# Patient Record
Sex: Male | Born: 1945
Health system: Southern US, Community
[De-identification: ages and names within clinical notes are randomized; demographics above are authoritative.]

## PROBLEM LIST (undated history)

## (undated) DIAGNOSIS — I10 Essential (primary) hypertension: Secondary | ICD-10-CM

## (undated) DIAGNOSIS — J302 Other seasonal allergic rhinitis: Secondary | ICD-10-CM

## (undated) DIAGNOSIS — E785 Hyperlipidemia, unspecified: Secondary | ICD-10-CM

## (undated) DIAGNOSIS — M1990 Unspecified osteoarthritis, unspecified site: Secondary | ICD-10-CM

## (undated) DIAGNOSIS — M549 Dorsalgia, unspecified: Secondary | ICD-10-CM

## (undated) DIAGNOSIS — I456 Pre-excitation syndrome: Secondary | ICD-10-CM

## (undated) DIAGNOSIS — I351 Nonrheumatic aortic (valve) insufficiency: Secondary | ICD-10-CM

## (undated) DIAGNOSIS — G8929 Other chronic pain: Secondary | ICD-10-CM

## (undated) HISTORY — DX: Hyperlipidemia, unspecified: E78.5

## (undated) HISTORY — DX: Pre-excitation syndrome: I45.6

## (undated) HISTORY — DX: Dorsalgia, unspecified: M54.9

## (undated) HISTORY — DX: Other chronic pain: G89.29

## (undated) HISTORY — DX: Essential (primary) hypertension: I10

## (undated) HISTORY — DX: Unspecified osteoarthritis, unspecified site: M19.90

## (undated) HISTORY — PX: APPENDECTOMY: SHX54

## (undated) HISTORY — DX: Other seasonal allergic rhinitis: J30.2

## (undated) HISTORY — PX: KNEE SURGERY: SHX244

## (undated) HISTORY — DX: Nonrheumatic aortic (valve) insufficiency: I35.1

## (undated) HISTORY — PX: CHOLECYSTECTOMY: SHX55

## (undated) HISTORY — PX: CERVICAL SPINE SURGERY: SHX589

---

## 1999-08-05 ENCOUNTER — Ambulatory Visit (HOSPITAL_COMMUNITY): Admission: RE | Admit: 1999-08-05 | Discharge: 1999-08-05 | Payer: Self-pay | Admitting: General Surgery

## 2000-03-16 ENCOUNTER — Encounter: Payer: Self-pay | Admitting: Emergency Medicine

## 2000-03-16 ENCOUNTER — Emergency Department (HOSPITAL_COMMUNITY): Admission: EM | Admit: 2000-03-16 | Discharge: 2000-03-16 | Payer: Self-pay | Admitting: Emergency Medicine

## 2000-06-09 ENCOUNTER — Emergency Department (HOSPITAL_COMMUNITY): Admission: EM | Admit: 2000-06-09 | Discharge: 2000-06-09 | Payer: Self-pay | Admitting: Emergency Medicine

## 2002-05-11 ENCOUNTER — Encounter (INDEPENDENT_AMBULATORY_CARE_PROVIDER_SITE_OTHER): Payer: Self-pay | Admitting: Specialist

## 2002-05-11 ENCOUNTER — Observation Stay (HOSPITAL_COMMUNITY): Admission: EM | Admit: 2002-05-11 | Discharge: 2002-05-11 | Payer: Self-pay | Admitting: Emergency Medicine

## 2003-06-10 ENCOUNTER — Ambulatory Visit (HOSPITAL_COMMUNITY): Admission: RE | Admit: 2003-06-10 | Discharge: 2003-06-10 | Payer: Self-pay | Admitting: Gastroenterology

## 2010-03-07 ENCOUNTER — Ambulatory Visit (HOSPITAL_COMMUNITY): Admission: RE | Admit: 2010-03-07 | Discharge: 2010-03-07 | Payer: Self-pay | Admitting: Neurosurgery

## 2011-03-20 LAB — BASIC METABOLIC PANEL
BUN: 17 mg/dL (ref 6–23)
CO2: 28 mEq/L (ref 19–32)
Calcium: 9.7 mg/dL (ref 8.4–10.5)
Chloride: 101 mEq/L (ref 96–112)
Creatinine, Ser: 1.48 mg/dL (ref 0.4–1.5)
GFR calc Af Amer: 58 mL/min — ABNORMAL LOW (ref >60)
GFR calc non Af Amer: 48 mL/min — ABNORMAL LOW (ref >60)
Glucose, Bld: 79 mg/dL (ref 70–99)
Potassium: 4.7 mEq/L (ref 3.5–5.1)
Sodium: 135 mEq/L (ref 135–145)

## 2011-03-20 LAB — CBC
HCT: 47.2 % (ref 39.0–52.0)
Hemoglobin: 16.1 g/dL (ref 13.0–17.0)
RBC: 5.05 MIL/uL (ref 4.22–5.81)
RDW: 13.7 % (ref 11.5–15.5)

## 2011-03-20 LAB — SURGICAL PCR SCREEN: MRSA, PCR: NEGATIVE

## 2011-03-28 ENCOUNTER — Ambulatory Visit (HOSPITAL_COMMUNITY)
Admission: RE | Admit: 2011-03-28 | Discharge: 2011-03-28 | Disposition: A | Discharge status: Home / Self Care | Payer: 59 | Source: Ambulatory Visit

## 2011-03-28 ENCOUNTER — Inpatient Hospital Stay (HOSPITAL_COMMUNITY)
Admission: EM | Admit: 2011-03-28 | Discharge: 2011-03-30 | DRG: 310 | Disposition: A | Discharge status: Home / Self Care | Payer: 59 | Attending: Cardiology | Admitting: Cardiology

## 2011-03-28 DIAGNOSIS — I080 Rheumatic disorders of both mitral and aortic valves: Secondary | ICD-10-CM | POA: Diagnosis present

## 2011-03-28 DIAGNOSIS — I10 Essential (primary) hypertension: Secondary | ICD-10-CM | POA: Diagnosis present

## 2011-03-28 DIAGNOSIS — G8929 Other chronic pain: Secondary | ICD-10-CM | POA: Diagnosis present

## 2011-03-28 DIAGNOSIS — E785 Hyperlipidemia, unspecified: Secondary | ICD-10-CM | POA: Diagnosis present

## 2011-03-28 DIAGNOSIS — Z7982 Long term (current) use of aspirin: Secondary | ICD-10-CM

## 2011-03-28 DIAGNOSIS — M129 Arthropathy, unspecified: Secondary | ICD-10-CM | POA: Diagnosis present

## 2011-03-28 DIAGNOSIS — I471 Supraventricular tachycardia, unspecified: Principal | ICD-10-CM | POA: Diagnosis present

## 2011-03-28 DIAGNOSIS — M549 Dorsalgia, unspecified: Secondary | ICD-10-CM | POA: Diagnosis present

## 2011-03-28 DIAGNOSIS — R0789 Other chest pain: Secondary | ICD-10-CM | POA: Diagnosis present

## 2011-03-28 DIAGNOSIS — I4949 Other premature depolarization: Secondary | ICD-10-CM | POA: Diagnosis present

## 2011-03-28 LAB — DIFFERENTIAL
Basophils Absolute: 0 K/uL (ref 0.0–0.1)
Basophils Relative: 0 % (ref 0–1)
Eosinophils Absolute: 0.1 K/uL (ref 0.0–0.7)
Eosinophils Relative: 1 % (ref 0–5)
Lymphocytes Relative: 24 % (ref 12–46)
Lymphs Abs: 2.1 10*3/uL (ref 0.7–4.0)
Monocytes Absolute: 0.8 K/uL (ref 0.1–1.0)
Monocytes Relative: 9 % (ref 3–12)
Neutro Abs: 5.6 10*3/uL (ref 1.7–7.7)
Neutrophils Relative %: 65 % (ref 43–77)

## 2011-03-28 LAB — BASIC METABOLIC PANEL WITH GFR
Calcium: 9.2 mg/dL (ref 8.4–10.5)
GFR calc Af Amer: >60 mL/min (ref >60)
GFR calc non Af Amer: 58 mL/min — ABNORMAL LOW (ref >60)
Glucose, Bld: 79 mg/dL (ref 70–99)
Sodium: 139 meq/L (ref 135–145)

## 2011-03-28 LAB — CBC
HCT: 46.3 % (ref 39.0–52.0)
Hemoglobin: 16.3 g/dL (ref 13.0–17.0)
MCH: 31.3 pg (ref 26.0–34.0)
MCHC: 35.2 g/dL (ref 30.0–36.0)
MCV: 89 fL (ref 78.0–100.0)
Platelets: 276 K/uL (ref 150–400)
RBC: 5.2 MIL/uL (ref 4.22–5.81)
RDW: 13.8 % (ref 11.5–15.5)
WBC: 8.6 10*3/uL (ref 4.0–10.5)

## 2011-03-28 LAB — BASIC METABOLIC PANEL
BUN: 10 mg/dL (ref 6–23)
CO2: 20 mEq/L (ref 19–32)
Chloride: 110 mEq/L (ref 96–112)
Creatinine, Ser: 1.26 mg/dL (ref 0.4–1.5)
Potassium: 3.6 mEq/L (ref 3.5–5.1)

## 2011-03-28 LAB — COMPREHENSIVE METABOLIC PANEL
ALT: 18 U/L (ref 0–53)
AST: 25 U/L (ref 0–37)
CO2: 25 mEq/L (ref 19–32)
Calcium: 9.4 mg/dL (ref 8.4–10.5)
Creatinine, Ser: 1.35 mg/dL (ref 0.4–1.5)
GFR calc Af Amer: >60 mL/min (ref >60)
GFR calc non Af Amer: 53 mL/min — ABNORMAL LOW (ref >60)
Glucose, Bld: 112 mg/dL — ABNORMAL HIGH (ref 70–99)
Sodium: 143 mEq/L (ref 135–145)
Total Protein: 7.5 g/dL (ref 6.0–8.3)

## 2011-03-28 LAB — CK TOTAL AND CKMB (NOT AT ARMC)
Relative Index: INVALID (ref 0.0–2.5)
Total CK: 53 U/L (ref 7–232)

## 2011-03-28 LAB — APTT: aPTT: 25 seconds (ref 24–37)

## 2011-03-28 LAB — MAGNESIUM: Magnesium: 2.1 mg/dL (ref 1.5–2.5)

## 2011-03-28 LAB — TROPONIN I: Troponin I: 0.04 ng/mL (ref 0.00–0.06)

## 2011-03-29 DIAGNOSIS — I471 Supraventricular tachycardia: Secondary | ICD-10-CM

## 2011-03-29 DIAGNOSIS — R55 Syncope and collapse: Secondary | ICD-10-CM

## 2011-03-29 LAB — CARDIAC PANEL(CRET KIN+CKTOT+MB+TROPI)
CK, MB: 0.8 ng/mL (ref 0.3–4.0)
CK, MB: 0.8 ng/mL (ref 0.3–4.0)
Relative Index: INVALID (ref 0.0–2.5)
Total CK: 42 U/L (ref 7–232)
Total CK: 48 U/L (ref 7–232)
Troponin I: 0.03 ng/mL (ref 0.00–0.06)

## 2011-03-29 LAB — CBC
Hemoglobin: 14.4 g/dL (ref 13.0–17.0)
MCH: 30.1 pg (ref 26.0–34.0)
Platelets: 255 10*3/uL (ref 150–400)
RBC: 4.78 MIL/uL (ref 4.22–5.81)
WBC: 7.3 10*3/uL (ref 4.0–10.5)

## 2011-03-29 LAB — POCT I-STAT, CHEM 8
BUN: 11 mg/dL (ref 6–23)
Calcium, Ion: 1.14 mmol/L (ref 1.12–1.32)
Chloride: 109 mEq/L (ref 96–112)
Creatinine, Ser: 1.3 mg/dL (ref 0.4–1.5)
Glucose, Bld: 79 mg/dL (ref 70–99)
TCO2: 21 mmol/L (ref 0–100)

## 2011-03-29 LAB — POCT CARDIAC MARKERS

## 2011-03-29 LAB — TSH: TSH: 2.561 u[IU]/mL (ref 0.350–4.500)

## 2011-03-30 LAB — BASIC METABOLIC PANEL
Calcium: 9.2 mg/dL (ref 8.4–10.5)
GFR calc Af Amer: >60 mL/min (ref >60)
GFR calc non Af Amer: 58 mL/min — ABNORMAL LOW (ref >60)
Glucose, Bld: 89 mg/dL (ref 70–99)
Potassium: 4.3 mEq/L (ref 3.5–5.1)
Sodium: 140 mEq/L (ref 135–145)

## 2011-03-31 NOTE — Consult Note (Signed)
NAMECORINTHIAN, MIZRAHI                ACCOUNT NO.:  [id number]  MEDICAL RECORD NO.:  [id number]           PATIENT TYPE:  I  LOCATION:  2022                         FACILITY:  [facility]  PHYSICIAN:  Doylene Canning. Ladona Ridgel, MD    DATE OF BIRTH:  [birth year]  DATE OF CONSULTATION:  03/29/2011 DATE OF DISCHARGE:  03/28/2011                                CONSULTATION   REFERRING PHYSICIAN:  Jake Bathe, MD  INDICATION FOR CONSULTATION:  Evaluation of SVT associated with history of WPW syndrome in a patient with shoulder and back pain associated with near-syncope.  HISTORY OF PRESENT ILLNESS:  The patient is a very pleasant 65 year old man who was diagnosed with WPW syndrome approximately 20 years ago.  He has had very minimal palpitations.  He has been otherwise asymptomatic, but over the last several months has noted increasingly frequent episodes where he would get dizzy and lightheaded.  He has not had any syncope.  He was in his usual state of health until yesterday when he presented to the emergency room with an episode of near syncope.  He noted that he also had associated left shoulder and leg numbness.  He felt lightheaded.  He denied frank chest pain and denied shortness of breath.  These episodes typically would last only a few seconds and they were associated with diaphoresis.  He was taken to the emergency room where he was found to have frequent PVCs and PACs and runs of SVT.  The patient notes that he would typically have spells like this lasting for several minutes at a time.  It would start and stop without warning. During these episodes, he would never have the sensation that his heart was beating fast.  He has never had frank syncope.  He otherwise feels well.  PAST MEDICAL HISTORY:  Notable for, 1. Hypertension. 2. Dyslipidemia. 3. Arthritis.  SURGICAL HISTORY:  Notable for knee surgery, appendectomy, and cholecystectomy.  He has had surgery on his cervical spine.  He was  not on beta-blockers on admission.  His outpatient medications include Benicar and aspirin, steroid nasal spray, simvastatin, and multivitamins.  His family history is notable for a father who died of stroke in his 71s.  Mother is alive and well.  SOCIAL HISTORY:  The patient is married.  He continues to work.  He stopped smoking cigarettes almost 10 years ago and denies alcohol abuse.  REVIEW OF SYSTEMS:  Negative except as noted in the HPI except for some mild arthritic complaints.  PHYSICAL EXAMINATION:  GENERAL:  He is a pleasant, very well-appearing middle-aged man in no distress. VITAL SIGNS:  Blood pressure was 130/80, the pulse was 80 and regular, respirations were 18, temperature was 98. HEENT:  Normocephalic and atraumatic.  Pupils equal and round. Oropharynx is moist.  Sclerae anicteric. NECK:  No jugular venous distention.  There is no thyromegaly. LUNGS:  Clear bilaterally to auscultation.  No wheezes, rales, or rhonchi are present. CARDIOVASCULAR:  Regular rate and rhythm with normal S1 and S2. ABDOMEN:  Soft, nontender, nondistended.  There was no organomegaly. Bowel sounds are present.  No rebound or  guarding. EXTREMITIES:  No cyanosis, clubbing, or edema.  Pulses were 2+ and symmetric. NEUROLOGIC:  Alert and oriented x3.  Cranial nerves intact.  Strength is 5/5 and symmetric.  The EKG demonstrates sinus rhythm with minimal pre-excitation and runs of narrow QRS tachycardia at 170 beats per minute.  There is also fairly frequent PVCs.  IMPRESSION: 1. Symptomatic supraventricular tachycardia with his symptoms being     manifested not by palpitations, but by dizziness and     lightheadedness, and back and arm discomfort without any obvious     anginal symptoms. 2. Hypertension. 3. Dyslipidemia. 4. Documented supraventricular tachycardia.  DISCUSSION:  The patient is having symptoms from his SVT and WPW syndrome, but has not been on medical therapy.  His  initial treatment with beta-blockers seems to have improved his ectopy.  I recommended that he be observed in the hospital for additional 12 hours and if his arrhythmias remain quiet be allowed to be discharged home.  My approach will be to see him back in the office in approximately 2 weeks.  If he continues to do well then we will continue medical therapy with a beta- blocker.  If he has any recurrent symptoms then catheter ablation would be warranted of his SVT.  I suspect this is a left lateral accessory pathway.  I have discussed all these issues with the patient and he agrees.  We will see him back in the office in several weeks.     Doylene Canning. Ladona Ridgel, MD     GWT/MEDQ  D:  03/29/2011  T:  03/30/2011  Job:  [id number]  cc:   Armanda Magic, M.D. Jake Bathe, MD  Electronically Signed by Lewayne Bunting MD on 03/31/2011 06:49:01 AM

## 2011-04-11 NOTE — Discharge Summary (Signed)
[name], [name]                ACCOUNT NO.:  [id number]  MEDICAL RECORD NO.:  [id number]           PATIENT TYPE:  I  LOCATION:  2022                         FACILITY:  [facility]  PHYSICIAN:  Phillip Bathe, MD      DATE OF BIRTH:  May 01, 1946  DATE OF ADMISSION:  03/28/2011 DATE OF DISCHARGE:  03/30/2011                              DISCHARGE SUMMARY   CARDIOLOGIST:  Phillip Bathe, MD  ELECTROPHYSIOLOGIST:  Phillip Canning. Ladona Ridgel, MD  PRIMARY PHYSICIAN:  Phillip Rua, MD, [name]  FINAL DIAGNOSES: 1. Supraventricular tachycardia/paroxysmal atrial tachycardia/prior     Wolff-Parkinson-White syndrome. 2. Dizziness - likely related to supraventricular     tachycardia/paroxysmal atrial tachycardia/prior Wolff-Parkinson-     White syndrome. 3. Atypical chest pain - likely musculoskeletal- cardiac biomarkers     all normal. 4. Moderate aortic regurgitation - calcified left coronary cusp with     normal left ventricular ejection fraction and chamber diameter with     aortic root at sinus of Valsalva 41 mm, mildly dilated.  This is     newly discovered.  This will be managed by surveillance with     echocardiogram. 5. Hyperlipidemia. 6. Hypertension. 7. Chronic back pain. 8. Seasonal allergies.  DRUG ALLERGIES:  No known drug allergies.  MEDICATIONS ON DISCHARGE: 1. Metoprolol 25 mg tartrate twice a day - new medication. 2. Aspirin 81 mg a day. 3. Benicar 20 mg a day. 4. Cyclobenzaprine 10 mg at bedtime. 5. Fluticasone spray daily. 6. Multivitamins a day daily. 7. MaxiVision eye vitamin daily. 8. Simvastatin 20 mg at bedtime. 9. Tylenol Extra Strength as needed. 10.Vitamin B12 two tablets 2500 mcg a day.  DISCHARGE LABORATORY DATA:  Sodium 140, potassium 4.3, BUN 9, creatinine 1.25.  Cardiac biomarkers were negative x3.  TSH was normal at 2.5. Hemoglobin 14.4, platelet count 255.  Chest x-ray showed no active disease. Echocardiogram demonstrated an EF of 55-60% with focal  calcification involving the left coronary the aortic cusp with moderate regurgitation. There was mild mitral regurgitation and the left atrium was mildly dilated and the aortic root was 41 mm, mildly dilated.  CONSULTATIONS:  Phillip Canning. Ladona Ridgel, MD, with Electrophysiology, [place].  BRIEF HOSPITAL COURSE:  [name] with hypertension, hyperlipidemia, and history of WPW by EKG with chronic back pain, prior cholecystectomy, appendectomy, knee and cervical spine surgery who was admitted overnight by Dr. Mayford Wright for dizziness, atypical chest pain, and presyncope.  While in the emergency department, he was demonstrating on telemetry, as well as 12-lead EKG, frequent runs of supraventricular tachycardia, possible paroxysmal atrial tachycardia with very frequent PVCs noted.  Metoprolol 25 mg twice a day was begun and successfully decreased the burden of both PVCs as well as SVT.  He was observed yesterday after Electrophysiology consultation by [name] and felt quite well and is eager for discharge.  He is describing no further presyncopal symptoms and no further atypical chest pain, which was mostly described as back discomfort, sharp in character, radiating to the front of the chest wall.  An echocardiogram was performed demonstrating normal ejection fraction, and moderate aortic  regurgitation was discovered with calcification of left coronary cusp of the aorta.  No wall motion abnormalities were visualized.  All cardiac biomarkers were negative.  No evidence of myocardial infarction.  Dr. Ladona Wright felt as though his dizziness was likely secondary to SVT and he agreed with metoprolol 25 mg twice a day, and he will see back in his office in 2 weeks.  If no symptoms and he is tolerating the beta-blocker well, then continue with this approach.  If he does have continued symptoms or intolerance of beta-blocker, then he was going to recommend catheter  ablation.  PHYSICAL EXAMINATION ON DISCHARGE:  VITAL SIGNS:  Temperature 97.6, pulse 58-69, respirations 18, blood pressure 116/75, satting 93% on room air. GENERAL:  He is alert and oriented x3, in no acute distress. CARDIOVASCULAR:  Regular rate and rhythm, difficult to appreciate a diastolic murmur of aortic regurgitation. LUNGS:  Clear to auscultation bilaterally. ABDOMEN:  Soft, nontender, normoactive bowel sounds. EXTREMITIES:  No rebound, no guarding.  Ambulating well without difficulty.  FOLLOWUP:  Dr. Ladona Wright will be seeing him in 2 weeks.  I also have scheduled an appointment to see me/[name] on Wednesday April 12, 2011, at 10:15 a.m.  At that point, we will assess his symptoms on the metoprolol.  We will also contemplate at that time whether or not we should proceed with nuclear stress test.  Like I said his discomfort was mostly spasm like and his main symptom of presyncope or dizziness was likely secondary to his rapid SVT.     Phillip Bathe, MD     MCS/MEDQ  D:  03/30/2011  T:  03/30/2011  Job:  [id number]  cc:   Phillip Canning. Ladona Ridgel, MD [name], M.D.  Electronically Signed by Phillip Schultz MD on 04/11/2011 06:33:02 AM

## 2011-04-13 ENCOUNTER — Encounter: Payer: Self-pay | Admitting: *Deleted

## 2011-04-13 ENCOUNTER — Encounter: Payer: Self-pay | Admitting: Internal Medicine

## 2011-04-13 ENCOUNTER — Ambulatory Visit (INDEPENDENT_AMBULATORY_CARE_PROVIDER_SITE_OTHER): Payer: 59 | Admitting: Internal Medicine

## 2011-04-13 VITALS — BP 112/78 | HR 60 | Ht 67.0 in | Wt 183.0 lb

## 2011-04-13 DIAGNOSIS — I471 Supraventricular tachycardia: Secondary | ICD-10-CM

## 2011-04-13 DIAGNOSIS — R0789 Other chest pain: Secondary | ICD-10-CM

## 2011-04-13 DIAGNOSIS — I456 Pre-excitation syndrome: Secondary | ICD-10-CM

## 2011-04-13 LAB — BASIC METABOLIC PANEL
BUN: 13 mg/dL (ref 6–23)
CO2: 27 mEq/L (ref 19–32)
Calcium: 9.4 mg/dL (ref 8.4–10.5)
Creatinine, Ser: 1.2 mg/dL (ref 0.4–1.5)
Glucose, Bld: 69 mg/dL — ABNORMAL LOW (ref 70–99)

## 2011-04-13 LAB — CBC WITH DIFFERENTIAL/PLATELET
Eosinophils Relative: 1.5 % (ref 0.0–5.0)
HCT: 44.7 % (ref 39.0–52.0)
Hemoglobin: 15.2 g/dL (ref 13.0–17.0)
Lymphs Abs: 2 10*3/uL (ref 0.7–4.0)
Monocytes Relative: 9.5 % (ref 3.0–12.0)
Neutro Abs: 6.3 10*3/uL (ref 1.4–7.7)
RDW: 14.3 % (ref 11.5–14.6)
WBC: 9.4 10*3/uL (ref 4.5–10.5)

## 2011-04-13 NOTE — Assessment & Plan Note (Signed)
I have discussed the treatment options with the patient. He continues to have symptoms of SVT despite medical therapy with beta blockers. The risks, goals, benefits, and expectations of  EP study and catheter ablation have been discussed with the patient. He wishes to proceed.

## 2011-04-13 NOTE — Patient Instructions (Signed)

## 2011-04-13 NOTE — Assessment & Plan Note (Signed)
The patient's symptoms are well controlled. He does have chest pressure with his SVT. He is scheduled for stress testing under the direction of Dr. Anne Fu.

## 2011-04-13 NOTE — Progress Notes (Signed)
HPI [name] returns today for followup. I met him several weeks ago in the hospital when he presented with symptomatic SVT. At that time he was found to have WPW syndrome. He was initially not on medical therapy and will start her on a beta blocker. Since then he has continued to have symptoms. He is interested in proceeding with ablation therapy. He has had no frank syncope. He notes that when his heart races he feels palpitations and shortness of breath. He has not had syncope. He feels mild chest pressure when his heart races. He is scheduled for a stress test as an outpatient. No Known Allergies   Current Outpatient Prescriptions  Medication Sig Dispense Refill  . acetaminophen (TYLENOL) 500 MG tablet Take 1,000 mg by mouth every 6 (six) hours as needed.        Marland Kitchen aspirin 81 MG tablet Take 81 mg by mouth daily.        Marland Kitchen BENICAR 20 MG tablet Take 1 tablet by mouth Daily.      . Cyanocobalamin (VITAMIN B 12 PO) Take 2,500 mg by mouth 2 (two) times daily.        . fluticasone (FLONASE) 50 MCG/ACT nasal spray 1 spray by Nasal route Daily. Each nostril      . metoprolol tartrate (LOPRESSOR) 25 MG tablet Take 1 tablet by mouth Twice daily.      . multivitamin (THERAGRAN) per tablet Take 1 tablet by mouth daily.        . NON FORMULARY Max vision eye vitamin. 1 tablet daily       . simvastatin (ZOCOR) 20 MG tablet Take 1 tablet by mouth At bedtime as needed.         No past medical history on file.  ROS:   All systems reviewed and negative except as noted in the HPI.   No past surgical history on file.   No family history on file.   History   Social History  . Marital Status: Married    Spouse Name: N/A    Number of Children: N/A  . Years of Education: N/A   Occupational History  . Not on file.   Social History Main Topics  . Smoking status: Former Games developer  . Smokeless tobacco: Not on file  . Alcohol Use: Not on file  . Drug Use: Not on file  . Sexually Active: Not on file    Other Topics Concern  . Not on file   Social History Narrative  . No narrative on file     BP 112/78  Pulse 60  Ht 5\' 7"  (1.702 m)  Wt 183 lb (83.008 kg)  BMI 28.66 kg/m2  Physical Exam:  Well appearing NAD HEENT: Unremarkable Neck:  No JVD, no thyromegally Lymphatics:  No adenopathy Back:  No CVA tenderness Lungs:  Clear HEART:  Regular rate rhythm, no murmurs, no rubs, no clicks Abd:  Flat, positive bowel sounds, no organomegally, no rebound, no guarding Ext:  2 plus pulses, no edema, no cyanosis, no clubbing Skin:  No rashes no nodules Neuro:  CN II through XII intact, motor grossly intact  EKG Normal sinus rhythm with ventricular preexcitation.  Assess/Plan:

## 2011-04-14 ENCOUNTER — Encounter: Payer: Self-pay | Admitting: Internal Medicine

## 2011-04-17 NOTE — H&P (Signed)
NAMEABDIEL, [name]                ACCOUNT NO.:  [id number]  MEDICAL RECORD NO.:  [id number]          PATIENT TYPE:  E  LOCATION:  [facility]                         FACILITY:  [facility]  PHYSICIAN:  Armanda Magic, M.D.     DATE OF BIRTH:  Oct 11, 1946  DATE OF ADMISSION:  03/28/2011 DATE OF DISCHARGE:  03/28/2011                             HISTORY & PHYSICAL   REFERRING PHYSICIAN:  Gavin Pound. Ghim, MD  CHIEF COMPLAINT:  Presyncope with chest pain.  PRIMARY PHYSICIAN:  Dr. Izola Price.  HISTORY OF PRESENT ILLNESS:  [name] with a history of Wolff-Parkinson-White which he says was diagnosed by EKG during exercise treadmill test who was in his usual state of health until March 10, 2011, when he started having a spasm under left shoulder blade associated with left leg and arm numbness which radiated to his head with dizziness and broke out in a sweat.  This would occur with brief episodes since then.  He denied any palpitations with these episodes. Today around 11 a.m., he felt fine, but worked it on the way home, had left shoulder and leg numbness with radiation to his head again and then felt lightheaded and had a presyncopal episode and fell to his knees. He denied any chest pain or shortness of breath at that time, although he says he has had some very short, brief episode of chest pain episodically lasting a few seconds at that time.  He was diaphoretic with a presyncopal episode today.  In the emergency room, he was noted to have multiple runs of SVT and PVCs.  Currently, he says his numbness resolved.  PAST MEDICAL HISTORY:  Includes history of WPW by EKG during a stress test, hypertension, dyslipidemia, back pain with arthritis.  PAST SURGICAL HISTORY:  Cholecystectomy, knee surgery, appendectomy, and cervical spine surgery.  ALLERGIES:  None.  MEDICATIONS: 1. Aspirin 81 mg daily. 2. Benicar 20 mg daily. 3. Fluticasone spray, 1 spray each nostril daily. 4.  MaxiVision Eye. 5. Vitamins daily. 6. Multivitamin daily. 7. Simvastatin 20 mg daily.  FAMILY HISTORY:  His mother is alive and well.  His father died of CVA and atherosclerosis, possibly of his heart at 37.  SOCIAL HISTORY:  He is married with two children.  He denies any tobacco use, he quit 8 years ago.  He denies any alcohol use.  REVIEW OF SYSTEMS:  Otherwise in HPI is negative.  PHYSICAL EXAMINATION:  VITAL SIGNS:  Blood pressure is 136/76, heart rate 95. GENERAL:  He is a well-developed, well-nourished white male in no acute distress. HEENT:  Benign. NECK:  Supple without lymphadenopathy.  Carotid upstrokes +2 bilaterally, no bruits. LUNGS:  Clear to auscultation throughout. HEART:  Regular rate and rhythm.  No murmurs, rubs, or gallops.  Normal S1 and S2. ABDOMEN:  Soft, nontender, nondistended.  Normoactive bowel sounds.  No hepatosplenomegaly. EXTREMITIES:  No cyanosis, erythema, or edema.  LABORATORY DATA:  Sodium 139, potassium 3.6, chloride 110, bicarb 20, BUN 10, creatinine 1.26, glucose 79.  White cell count 8.6, hemoglobin 16.3, hematocrit 46.3, platelet count 276.  EKG shows sinus rhythm with frequent  PVCs.  Telemetry shows long runs of narrow complex tachycardia.  ASSESSMENT: 1. Presyncope with numbness in the left side and head most likely     secondary to #4. 2. Mild transient chest pain. 3. Frequent ventricular ectopy. 4. Supraventricular tachycardia. 5. History of WPW, the patient states diagnosed during a stress test. 6. Hypertension. 7. Dyslipidemia.  PLAN:  Admit to telemetry bed.  Cycle cardiac enzymes.  Lopressor 25 mg b.i.d.  Check 2-D echocardiogram to evaluate LV function, EP consult in the morning.  Continue home medications.  We will start IV heparin drip per pharmacy until patient's rule out is complete.     Armanda Magic, M.D.     TT/MEDQ  D:  03/28/2011  T:  03/29/2011  Job:  [id number]  cc:   Dr. Izola Price  Electronically Signed by  Armanda Magic M.D. on 04/17/2011 07:43:34 PM

## 2011-04-20 ENCOUNTER — Ambulatory Visit (HOSPITAL_COMMUNITY)
Admission: RE | Admit: 2011-04-20 | Discharge: 2011-04-21 | Disposition: A | Discharge status: Home / Self Care | Payer: 59 | Source: Ambulatory Visit | Attending: Internal Medicine | Admitting: Internal Medicine

## 2011-04-20 DIAGNOSIS — M549 Dorsalgia, unspecified: Secondary | ICD-10-CM | POA: Insufficient documentation

## 2011-04-20 DIAGNOSIS — I456 Pre-excitation syndrome: Secondary | ICD-10-CM | POA: Insufficient documentation

## 2011-04-20 DIAGNOSIS — I10 Essential (primary) hypertension: Secondary | ICD-10-CM | POA: Insufficient documentation

## 2011-04-20 DIAGNOSIS — E785 Hyperlipidemia, unspecified: Secondary | ICD-10-CM | POA: Insufficient documentation

## 2011-04-20 DIAGNOSIS — R55 Syncope and collapse: Secondary | ICD-10-CM | POA: Insufficient documentation

## 2011-04-20 DIAGNOSIS — I471 Supraventricular tachycardia: Secondary | ICD-10-CM

## 2011-04-20 DIAGNOSIS — I359 Nonrheumatic aortic valve disorder, unspecified: Secondary | ICD-10-CM | POA: Insufficient documentation

## 2011-04-20 DIAGNOSIS — G8929 Other chronic pain: Secondary | ICD-10-CM | POA: Insufficient documentation

## 2011-04-20 LAB — POCT ACTIVATED CLOTTING TIME
Activated Clotting Time: 211 seconds
Activated Clotting Time: 240 seconds

## 2011-04-21 LAB — POCT ACTIVATED CLOTTING TIME: Activated Clotting Time: 181 seconds

## 2011-04-24 NOTE — Op Note (Signed)
[name], [name]                ACCOUNT NO.:  [id number]  MEDICAL RECORD NO.:  [id number]           PATIENT TYPE:  O  LOCATION:  4743                         FACILITY:  [facility]  PHYSICIAN:  Doylene Canning. Ladona Ridgel, MD    DATE OF BIRTH:  [birth year]  DATE OF PROCEDURE:  04/20/2011 DATE OF DISCHARGE:                              OPERATIVE REPORT   PROCEDURE PERFORMED:  Electrophysiologic study and RF catheter ablation of a manifested left lateral accessory pathway.  HISTORY OF PRESENT ILLNESS:  The patient is a 65 year old man with a long history of tachy palpitations.  He has documented but mild pre- excitation on baseline EKG.  The patient has had worsening palpitations despite beta-blocker therapy and is now referred for electrophysiologic study and catheter ablation.  PROCEDURE:  After informed consent was obtained, the patient was taken to the diagnostic EP lab in the fasting state.  After usual preparation and draping, a 6-French hexapolar catheter was inserted percutaneously into the right jugular vein and advanced to the coronary sinus.  A 5- French quadripolar catheter was inserted percutaneously in the right femoral vein and advanced to the His bundle region.  A 5-French quadripolar catheter was inserted percutaneously in the right femoral vein and advanced to the right ventricle.  After measurement of the basic intervals, rapid ventricular pacing was carried out from the right ventricle.  During rapid ventricular pacing, the atrial activation was midline and decremental.  Next, programed ventricular stimulation was carried out from the right ventricle at a base drive cycle length of 045 msec.  The S1 and S2 interval was stepwise decreased down to 280 msec where the retrograde AV node ERP was observed.  During programed ventricular stimulation, the atrial activation was again midline and decremental.  Next, programed atrial stimulation was carried out from the coronary sinus at  the base drive cycle length of 409 msec.  The S1 and S2 interval was stepwise decreased down to 230 msec where there was atrial refractoriness.  It should be noted that during programed atrial stimulation the antegrade conduction over the patient's accessory pathway was maximally preexcited.  Rapid atrial pacing from the coronary sinus was carried out and stepwise decreased down to 280 msec and accessory pathway conduction persisted.  Additional decrements down to 230 msec resulted in persistence of accessory pathway conduction.  At this point with the inability to induce SVT, the inability to map the left atrium with RV pacing over the pathway, the 7-French quadripolar ablation catheter was inserted percutaneously into the right femoral artery and advanced into the left ventricle.  Heparin 5000 units was given intravenously.  Mapping of the earliest ventricular activation was then carried out utilizing pacing from the coronary sinus atrium.  This demonstrated the earliest ventricular activation to be at a location approximately 4 o'clock on the mitral valve annulus.  Mapping was made more difficult because the ventricular insertion on the coronary sinus electrogram was quite small.  Initially, mapping of the ventricular insertion of the pathway was carried out, and with pacing from the coronary sinus early activation sequences were demonstrated with fusion of the A and  the V.  RF energy was subsequently applied with three RF energy applications resulting in the ablation successfully of the accessory pathway.  After observation for approximately 3 minutes, accessory pathway conduction commenced.  The ablation catheter was then maneuvered onto the atrial insertion of the accessory pathway and two RF energy applications were delivered here resulting in cessation of accessory pathway conduction.  After several minutes, it was noted that the accessory pathway conduction had recurred, although  it was intermittent.  Pacing from the coronary sinus at 600 msec demonstrated that every other beat was conducted through the accessory pathway.  A total of 15 additional RF energy applications were delivered between the atrial and ventricular insertions of the accessory pathway between 3 and 5 o'clock on the mitral valve annulus.  There was persistence of the intermittent antegrade only accessory pathway conduction.  At this point and after approximately 55 minutes of fluoroscopic exposure, it was deemed most appropriate to pace additionally in the coronary sinus. This demonstrated pathway Wenckebach at 470 msec.  With no VA activation over the pathway and only minimal AV activation of the pathway, it was deemed most appropriate to terminate the procedure at this point.  The catheters were removed and the patient was returned to the recovery area for removal of the sheaths once the ACT was allowed to drift down below 190 seconds.  COMPLICATIONS:  There were no immediate procedure complications.  RESULTS: 1. Baseline ECG.  Baseline ECG demonstrates sinus rhythm with     intermittent ventricular pre-excitation. 2. Baseline intervals with sinus node cycle length was 800 msec.  The     QRS duration was 100 msec.  The HV interval was around 35 msec.     The AH interval was 65 msec. 3. Rapid ventricular pacing.  Rapid ventricular pacing was carried out     from the right ventricle and stepwise decreased down to 320 msec     where VA Wenckebach was observed.  During rapid ventricular pacing,     the atrial activation sequence was midline and decremental. 4. Programed ventricular stimulation.  Programed ventricular     stimulation was carried out from the right ventricle at base drive     cycle length of 500 msec.  The S1 and S2 interval subsequently     decreased down to 320 msec where VA Wenckebach was observed.     During rapid ventricular pacing, the atrial activation sequence was      again midline and decremental.  The above pacing demonstrated no VA     conduction over the accessory pathway. 5. Rapid atrial pacing.  Rapid atrial pacing was carried out from the     right atrium.  The coronary sinus.  When pacing from the coronary     sinus, it demonstrated maximal pre-excitation.  Pacing down below     280 msec resulting in continuation of accessory pathway conduction. 6. Programed atrial stimulation.  Programed atrial stimulation was     carried out from the coronary sinus at base drive cycle length of     500 msec.  The S1 and S2 interval was stepwise decreased down 230     msec where atrial refractoriness was demonstrated.  During     programed atrial stimulation, there was maximal pre-excitation with     conduction over the accessory pathway. 7. Arrhythmias observed.  There were no inducible supraventricular or     ventricular arrhythmias. 8. Mapping.  Mapping of the patient's accessory pathway demonstrated  earliest ventricular activation at a location approximately 4     o'clock on the mitral valve annulus.  RF energy application was     applied both on the  Dictation ended at this point.     Doylene Canning. Ladona Ridgel, MD     GWT/MEDQ  D:  04/20/2011  T:  04/21/2011  Job:  [id number]  Electronically Signed by Lewayne Bunting MD on 04/24/2011 08:00:16 AM

## 2011-04-26 ENCOUNTER — Telehealth: Payer: Self-pay | Admitting: Internal Medicine

## 2011-04-26 ENCOUNTER — Ambulatory Visit (INDEPENDENT_AMBULATORY_CARE_PROVIDER_SITE_OTHER): Payer: 59 | Admitting: Cardiovascular Disease

## 2011-04-26 ENCOUNTER — Encounter: Payer: Self-pay | Admitting: Cardiovascular Disease

## 2011-04-26 VITALS — BP 139/80 | HR 56 | Resp 18 | Ht 67.0 in | Wt 182.0 lb

## 2011-04-26 DIAGNOSIS — I456 Pre-excitation syndrome: Secondary | ICD-10-CM

## 2011-04-26 DIAGNOSIS — I471 Supraventricular tachycardia: Secondary | ICD-10-CM

## 2011-04-26 NOTE — Telephone Encounter (Signed)
Patient states has been having constant pressure on his shoulder blades and occasional heart  fluttering  Same as  prior ablation but no dizziness. Ablation was done last Thursday April 26 th 2012.  Pt. States was taken Lopressor 25 mg one tablet twice a day . Now he is taken 1/2 tablet twice a day until this Friday then 1/2 tablet once a day for one week as recommended per Dr. Ladona Ridgel. Pt. Would like to know what to do , because he is planning a long driving trip.

## 2011-04-26 NOTE — Telephone Encounter (Signed)
Dr. Excell Seltzer Recommends for to come to the office for an EKG. An appointment was made for a nurse visit, for an EKG. Patient aware.

## 2011-04-26 NOTE — Telephone Encounter (Signed)
EKG  And VS done. Pt. Has a office visit with Dr. Excell Seltzer to be seen today.

## 2011-04-26 NOTE — Patient Instructions (Signed)
Your physician has recommended you make the following change in your medication: START Metoprolol Tartrate 25mg  one tablet by mouth twice a day, you can take an extra 25mg  Metoprolol as needed for palpitations.   Your physician recommends that you keep your scheduled follow-up appointment with Dr Ladona Ridgel.

## 2011-04-26 NOTE — Telephone Encounter (Signed)
Pt calling re some problems having since ablation

## 2011-05-01 NOTE — Assessment & Plan Note (Signed)
The patient has recurrent symptoms but they are not as bad as prior to catheter ablation. His exam is normal and EKG shows a little less of a delta wave in his lateral precordial leads. I recommended that he resume his previous metoprolol dose of 25 mg twice daily with an additional dose of metoprolol as needed for symptomatic palpitations. He will keep his follow-up with Dr Ladona Ridgel next month and will call if symptoms worsen.

## 2011-05-01 NOTE — Progress Notes (Signed)
HPI:  65 year-old male presenting for evaluation of palpitations and pain around the left scapula. The patient has WPW and underwent catheter ablation with modification of his accessory pathway on April 26th. The patient initially did well, but he has developed recurrent symptoms. He states his symptoms are similar to those he had prior to ablation. These include palpitations and pressure in the left parascapular region. He denies exertional chest pain or pressure, edema, orthopnea, or PND. He notes that he has reduced his beta blocker since his ablation but hasn't made any other med changes.  Outpatient Encounter Prescriptions as of 04/26/2011  Medication Sig Dispense Refill  . acetaminophen (TYLENOL) 500 MG tablet Take 1,000 mg by mouth every 6 (six) hours as needed.        Marland Kitchen aspirin 81 MG tablet Take 81 mg by mouth daily.        Marland Kitchen BENICAR 20 MG tablet Take 1 tablet by mouth Daily.      . Cyanocobalamin (VITAMIN B 12 PO) Take 2,500 mg by mouth. 2 tablets 3 days a week      . fluticasone (FLONASE) 50 MCG/ACT nasal spray 1 spray by Nasal route Daily. Each nostril      . metoprolol tartrate (LOPRESSOR) 25 MG tablet Take one tablet by mouth two times a day, you can take an extra 25mg  daily as needed for palpitations      . multivitamin (THERAGRAN) per tablet Take 1 tablet by mouth daily.        . NON FORMULARY Max vision eye vitamin. 1 tablet daily       . simvastatin (ZOCOR) 20 MG tablet Take 1 tablet by mouth At bedtime as needed.      Marland Kitchen DISCONTD: metoprolol tartrate (LOPRESSOR) 25 MG tablet Take 1 tablet by mouth. Take 1/2 tablet twice daily        Allergies  Allergen Reactions  . Lisinopril Cough    No past medical history on file.  ROS: Negative except as per HPI  BP 139/80  Pulse 56  Resp 18  Ht 5\' 7"  (1.702 m)  Wt 182 lb (82.555 kg)  BMI 28.51 kg/m2  PHYSICAL EXAM: Pt is alert and oriented, NAD HEENT: normal Neck: JVP - normal, carotids 2+= without bruits Lungs: CTA  bilaterally CV: RRR without murmur or gallop Abd: soft, NT, Positive BS, no hepatomegaly Ext: no C/C/E, distal pulses intact and equal Skin: warm/dry no rash  EKG:  Sinus brady 58 bpm, WPW pattern  ASSESSMENT AND PLAN:

## 2011-05-12 NOTE — Op Note (Signed)
   NAME:  [name], [name]                          ACCOUNT NO.:  [id number]   MEDICAL RECORD NO.:  [id number]                   PATIENT TYPE:  AMB   LOCATION:  ENDO                                 FACILITY:  [facility]   PHYSICIAN:  Graylin Shiver, M.D.                DATE OF BIRTH:  [birth year]   DATE OF PROCEDURE:  06/10/2003  DATE OF DISCHARGE:                                 OPERATIVE REPORT   PROCEDURE PERFORMED:  Colonoscopy.   INDICATIONS FOR PROCEDURE:  Screening.   Informed consent was obtained.   PREMEDICATION:  Fentanyl 80 mcg IV, Versed 9 mg IV.   DESCRIPTION OF PROCEDURE:  With the patient in the left lateral decubitus  position, a rectal exam was performed.  No masses were felt.  The Olympus  colonoscope was inserted into the rectum and advanced around the colon to  the cecum.  Cecal landmarks were identified.  The cecum and ascending colon  were normal.  The transverse colon was normal.  The descending colon was  normal.  The sigmoid showed an occasional diverticulum.  The rectum was  normal.  The patient tolerated the procedure well without complications.   IMPRESSION:  Minimal diverticulosis of the sigmoid, otherwise normal  colonoscopy to the cecum.                                               Graylin Shiver, M.D.    SFG/MEDQ  D:  06/10/2003  T:  06/10/2003  Job:  [id number]   cc:   Dellis Anes. Idell Pickles, M.D.  [address]  [place]  [place] [address]  Fax: [phone]

## 2011-05-12 NOTE — Op Note (Signed)
[facility]  Patient:    [name], [name] Visit Number: [id number] MRN: [id number]          Service Type: OBV Location: 1E 0102 01 Attending Physician:  Benny Lennert Dictated by:   Zigmund Daniel, M.D. Proc. Date: 05/11/02 Admit Date:  05/11/2002   CC:         Tyson Dense, M.D.   Operative Report  PREOPERATIVE DIAGNOSIS:  Acute appendicitis.  POSTOPERATIVE DIAGNOSIS:  Acute appendicitis.  OPERATION:  Laparoscopic appendectomy.  SURGEON:  Zigmund Daniel, M.D.  ANESTHESIA:  General.  DESCRIPTION OF PROCEDURE:  After the patient was monitored and anesthetized and had routine preparation and draping of the abdomen, I made a short transverse incision just below the umbilicus through an anesthetized site.  I cut the fascia in the midline, then bluntly opened the peritoneum and assured good access to the peritoneal cavity.  I then placed an 0 Vicryl pursestring suture in the fascia, secured a Hasson cannula and inflated the abdomen with CO2.  I put in the camera and saw an acutely inflamed appendix.  There were a number of adhesions around the cecum and in the right upper quadrant due to previous laparoscopic cholecystectomy.  Through anesthetized sites, I placed a 5 mm port in the right upper quadrant under direct vision and an 11 mm port in the lower midline under direct vision.  They went in without any evidence of injury to the viscera.  All of the viscera appeared to be normal except for the appendix which was acutely inflamed but did not appear to be ruptured.  I saw no free peritoneal fluid.  With scissors and cautery, I took down the adhesions so that there was good mobility of the cecum.  I then grasped the appendix and elevated it and removed some of the fat of mesentery.  I then stapled the mesentery and base of the appendix with endoscopic cutting stapler, and it seemed to give good hemostasis and completely divided  the appendix from the cecum.  I then irrigated the right lower quadrant and removed the irrigant and assured that I had good hemostasis.  I placed the appendix in a plastic pouch and removed it through the umbilical incision and tied the pursestring suture.  I removed the lateral port under direct vision and then removed the lower midline port after allowing the CO2 to escape.  I closed all the skin incisions with intracuticular 4-0 Vicryl and Steri-Strips. The patient tolerated the operation well. Dictated by:   Zigmund Daniel, M.D. Attending Physician:  Benny Lennert DD:  05/11/02 TD:  05/11/02 Job: [id number]

## 2011-05-14 IMAGING — CR DG CHEST 2V
2 series · 2 of 2 positions shown · non-contrast
Comparison: None

CLINICAL DATA: Preoperative respiratory exam for cervical surgery.
Smoking history.

CHEST - 2 VIEW

[view not recorded (1 of 2)]
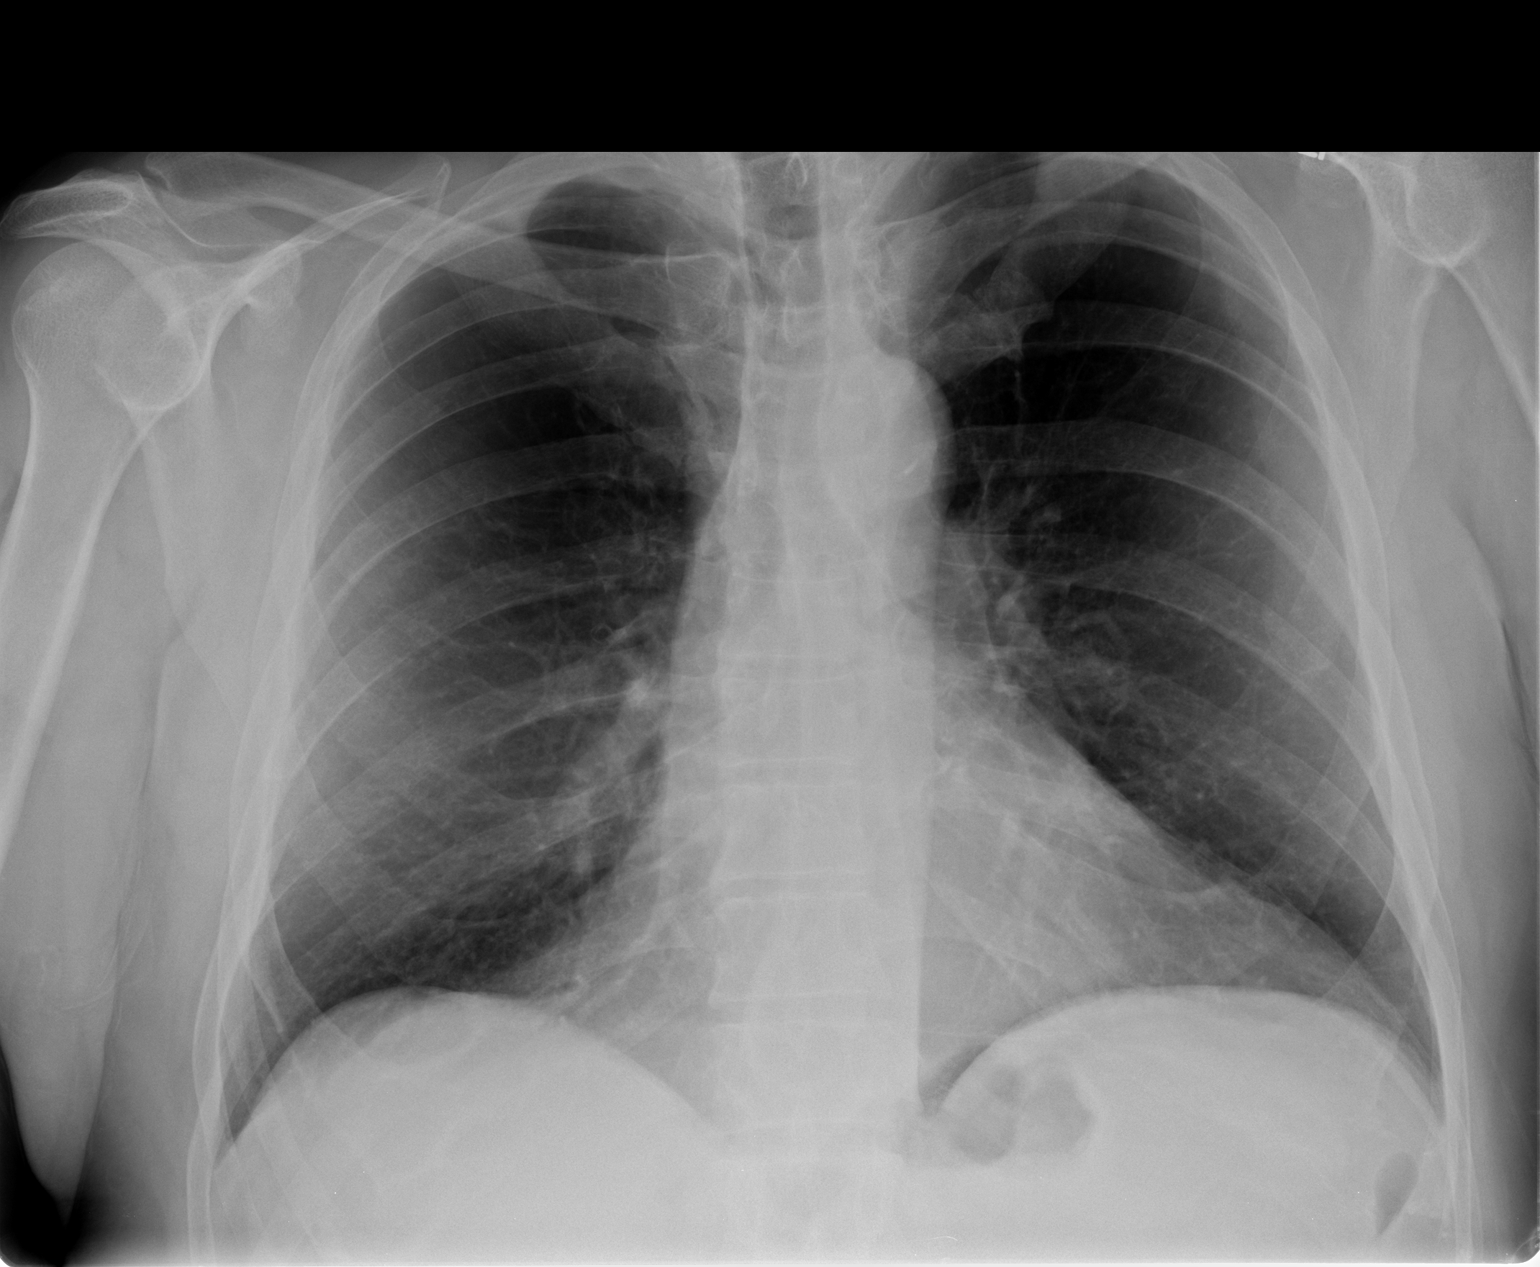

[view not recorded (2 of 2)]
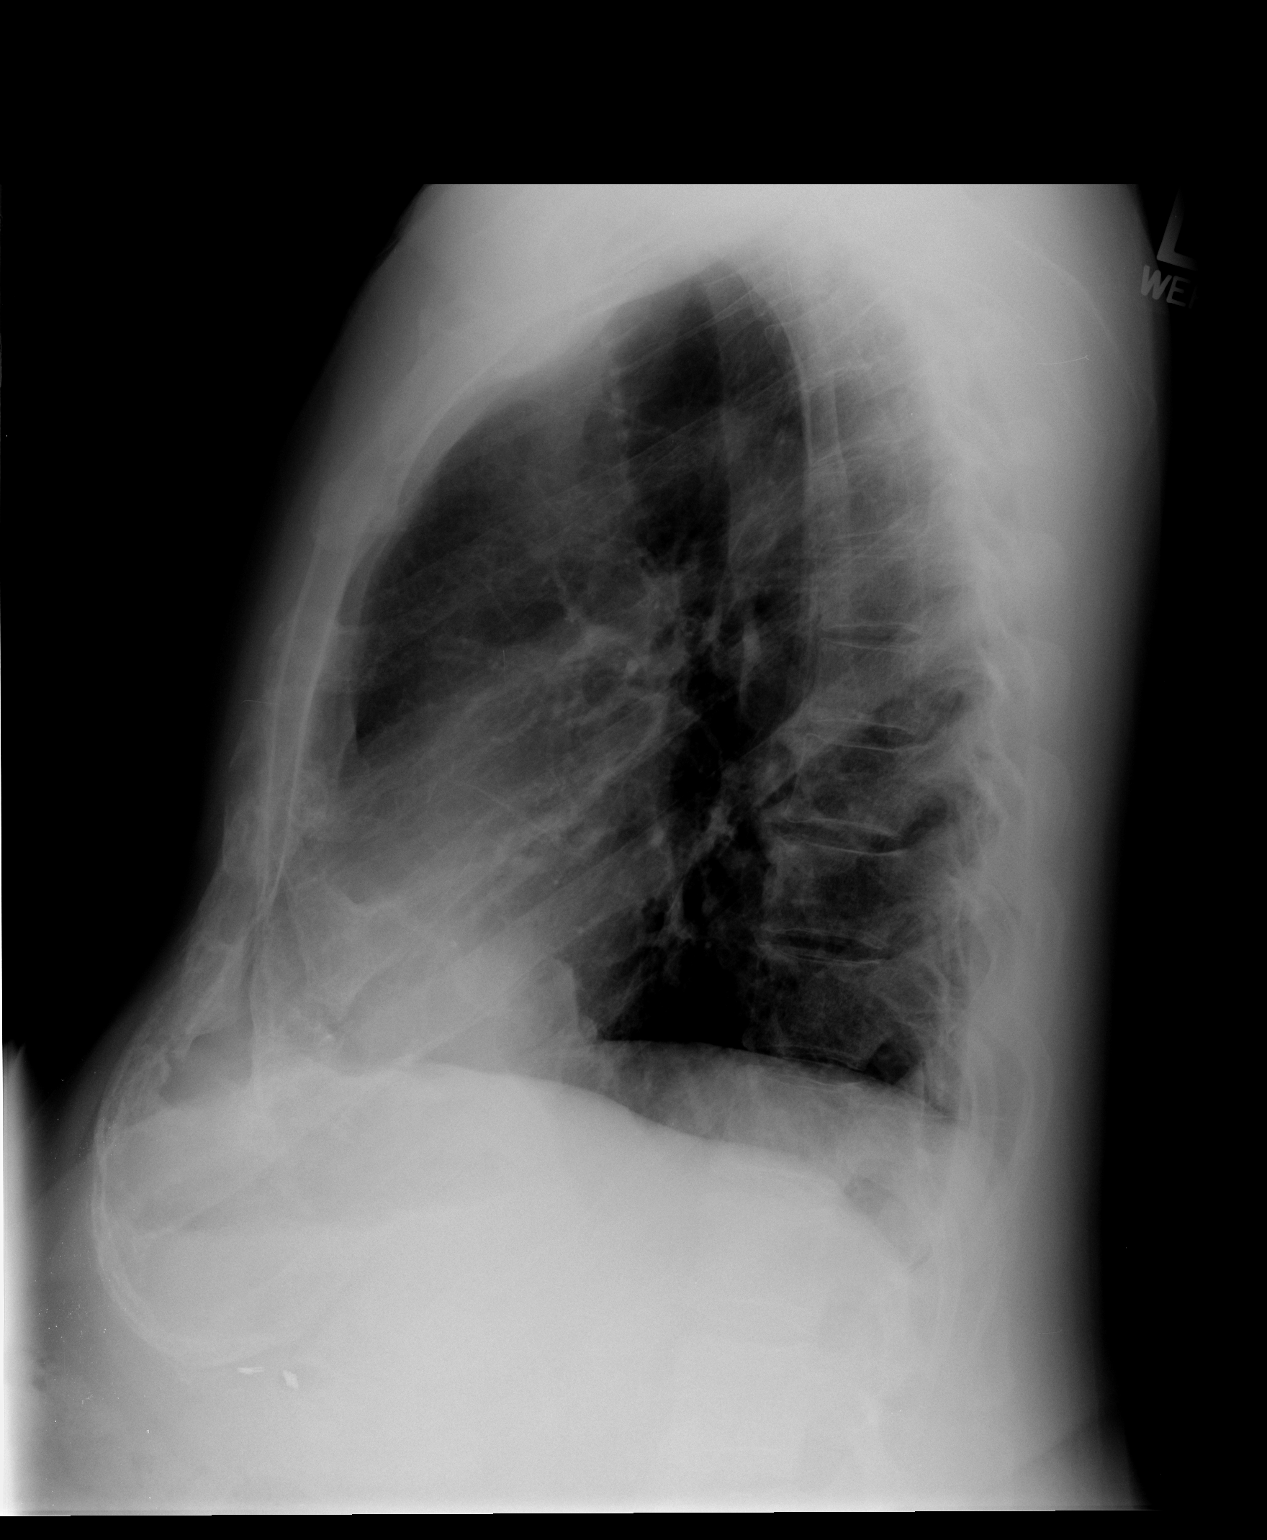

[2 of 2 positions shown; findings below may reference images not displayed]

FINDINGS: Heart size is normal.  The mediastinum is unremarkable.
The lungs are clear.  No effusions.  No bony abnormalities.
IMPRESSION: Normal chest

## 2011-05-15 ENCOUNTER — Telehealth: Payer: Self-pay | Admitting: Physician Assistant

## 2011-05-15 NOTE — Telephone Encounter (Signed)
Pt recently saw Dr. Excell Seltzer for followup ablation, was told to notify our office if his heart rate was falling below 50. He noted today it was 44, but at times has also been in the 70s. Advised pt to cut back to half tablet of 25mg  tablets bid for now. He has done this in the past but symptoms have recurred. For tonight I think this is our best option. Will forward to Dr. Ladona Ridgel / Dr. Excell Seltzer for further review as to if there is anything additional that should be done. Pt expressed understanding and gratitude.

## 2011-05-16 ENCOUNTER — Emergency Department (HOSPITAL_COMMUNITY): Payer: 59

## 2011-05-16 ENCOUNTER — Telehealth: Payer: Self-pay | Admitting: Internal Medicine

## 2011-05-16 ENCOUNTER — Emergency Department (HOSPITAL_COMMUNITY)
Admission: EM | Admit: 2011-05-16 | Discharge: 2011-05-17 | Disposition: A | Discharge status: Home / Self Care | Payer: 59 | Attending: Emergency Medicine | Admitting: Emergency Medicine

## 2011-05-16 DIAGNOSIS — I456 Pre-excitation syndrome: Secondary | ICD-10-CM | POA: Insufficient documentation

## 2011-05-16 DIAGNOSIS — R42 Dizziness and giddiness: Secondary | ICD-10-CM | POA: Insufficient documentation

## 2011-05-16 DIAGNOSIS — M25519 Pain in unspecified shoulder: Secondary | ICD-10-CM | POA: Insufficient documentation

## 2011-05-16 DIAGNOSIS — R51 Headache: Secondary | ICD-10-CM | POA: Insufficient documentation

## 2011-05-16 DIAGNOSIS — E785 Hyperlipidemia, unspecified: Secondary | ICD-10-CM | POA: Insufficient documentation

## 2011-05-16 DIAGNOSIS — R110 Nausea: Secondary | ICD-10-CM | POA: Insufficient documentation

## 2011-05-16 DIAGNOSIS — Z79899 Other long term (current) drug therapy: Secondary | ICD-10-CM | POA: Insufficient documentation

## 2011-05-16 DIAGNOSIS — R61 Generalized hyperhidrosis: Secondary | ICD-10-CM | POA: Insufficient documentation

## 2011-05-16 DIAGNOSIS — I10 Essential (primary) hypertension: Secondary | ICD-10-CM | POA: Insufficient documentation

## 2011-05-16 DIAGNOSIS — R6884 Jaw pain: Secondary | ICD-10-CM | POA: Insufficient documentation

## 2011-05-16 DIAGNOSIS — R5381 Other malaise: Secondary | ICD-10-CM | POA: Insufficient documentation

## 2011-05-16 DIAGNOSIS — R0602 Shortness of breath: Secondary | ICD-10-CM | POA: Insufficient documentation

## 2011-05-16 DIAGNOSIS — Z7982 Long term (current) use of aspirin: Secondary | ICD-10-CM | POA: Insufficient documentation

## 2011-05-16 LAB — CBC
Hemoglobin: 13.8 g/dL (ref 13.0–17.0)
Platelets: 243 10*3/uL (ref 150–400)
RBC: 4.43 MIL/uL (ref 4.22–5.81)
WBC: 12.2 10*3/uL — ABNORMAL HIGH (ref 4.0–10.5)

## 2011-05-16 LAB — DIFFERENTIAL
Basophils Absolute: 0 10*3/uL (ref 0.0–0.1)
Basophils Relative: 0 % (ref 0–1)
Eosinophils Absolute: 0.2 10*3/uL (ref 0.0–0.7)
Neutro Abs: 9.1 10*3/uL — ABNORMAL HIGH (ref 1.7–7.7)
Neutrophils Relative %: 75 % (ref 43–77)

## 2011-05-16 NOTE — Telephone Encounter (Signed)
Ms Lamm called because pt was cold, clammy and had HA. Called her and EMS was there to Tx. She said something was wrong with his heart and she was u/a to talk. Agreed that EMS was the best option.

## 2011-05-16 NOTE — Telephone Encounter (Signed)
Pt called the on call doctor last night in reference to his blood pressure.  He was advised to change medication last night and this am,  Was told to call office if he had not heard from [place] by 3:00 regarding the medication changes. Not feeling much better today. Pale, and fatigued.

## 2011-05-16 NOTE — Telephone Encounter (Signed)
Spoke with pt and let him know I would discuss his symptoms with Dr Ladona Ridgel

## 2011-05-17 ENCOUNTER — Emergency Department (HOSPITAL_COMMUNITY): Payer: 59

## 2011-05-17 LAB — CK TOTAL AND CKMB (NOT AT ARMC)
Relative Index: INVALID (ref 0.0–2.5)
Total CK: 39 U/L (ref 7–232)

## 2011-05-17 LAB — URINALYSIS, ROUTINE W REFLEX MICROSCOPIC
Glucose, UA: NEGATIVE mg/dL
Ketones, ur: NEGATIVE mg/dL
Nitrite: NEGATIVE
pH: 7.5 (ref 5.0–8.0)

## 2011-05-17 LAB — BASIC METABOLIC PANEL
BUN: 14 mg/dL (ref 6–23)
Chloride: 108 mEq/L (ref 96–112)
Creatinine, Ser: 1.04 mg/dL (ref 0.4–1.5)
GFR calc Af Amer: >60 mL/min (ref >60)
GFR calc non Af Amer: >60 mL/min (ref >60)
Potassium: 3.6 mEq/L (ref 3.5–5.1)

## 2011-05-17 LAB — TROPONIN I: Troponin I: <0.3 ng/mL (ref <0.30)

## 2011-05-17 NOTE — Telephone Encounter (Signed)
Spoke to pt's wife this morning.  I had spoken with pt last night and after he had gone to the ER  Looking at hospital notes looks like all checked out ok  His EKG showed NSR with PVC's  This could be why he is dizzy  He is going to see Dr Anne Fu tomorrow and keep his follow up appointment with Dr Ladona Ridgel on 05/31/11

## 2011-05-18 NOTE — Op Note (Signed)
  [name], [name]                ACCOUNT NO.:  [id number]  MEDICAL RECORD NO.:  [id number]           PATIENT TYPE:  LOCATION:                                 FACILITY:  PHYSICIAN:  Doylene Canning. Ladona Ridgel, MD    DATE OF BIRTH:  20-Jun-1946  DATE OF PROCEDURE: DATE OF DISCHARGE:                              OPERATIVE REPORT   ADDENDUM  1. IRF energy application.  A total of 20 RF energy applications were     delivered to the atrial and ventricular insertion of the accessory     pathway.  After the first five RF energy applications, the     patient's accessory pathway conduction went away, however, it     returned in an intermittent form in that every other beat was pre-     excited.  Following ablation, pacing at 470 milliseconds resulted     in cessation of accessory pathway conduction. 2. Conclusion:  This study demonstrates a manifest left lateral     accessory pathway.  It demonstrates no evidence of inducible SVT.     It demonstrates that the patient's manifest pathway was potentially     very life-threatening with conduction down to 230 milliseconds.  It     demonstrates successful modification of the accessory pathway.     Following ablation, accessory pathway conduction terminated     completely at 470 milliseconds and was intermittent all the way up     to 700 milliseconds.     Doylene Canning. Ladona Ridgel, MD     GWT/MEDQ  D:  04/20/2011  T:  04/21/2011  Job:  [id number]  Electronically Signed by Lewayne Bunting MD on 05/18/2011 12:51:25 PM

## 2011-05-19 NOTE — Discharge Summary (Signed)
[name], [name]                ACCOUNT NO.:  [id number]  MEDICAL RECORD NO.:  [id number]           PATIENT TYPE:  O  LOCATION:  4743                         FACILITY:  [facility]  PHYSICIAN:  Hillis Range, MD       DATE OF BIRTH:  [birth year]  DATE OF ADMISSION:  04/20/2011 DATE OF DISCHARGE:  04/21/2011                              DISCHARGE SUMMARY   PRIMARY CARE PHYSICIAN:  Joycelyn Rua, MD  PRIMARY CARDIOLOGIST:  Jake Bathe, MD  ELECTROPHYSIOLOGIST:  Doylene Canning. Ladona Ridgel, MD  PRIMARY DIAGNOSIS:  Wolff-Parkinson-White syndrome.  SECONDARY DIAGNOSES: 1. Presyncope. 2. Moderate aortic regurgitation. 3. Hyperlipidemia. 4. Hypertension. 5. Chronic back pain. 6. Seasonal allergies.  ALLERGIES:  The patient has no known drug allergies.  PROCEDURES THIS ADMISSION:  Electrophysiology study and radiofrequency catheter ablation of manifest left lateral accessory pathway on April 20, 2011 by Dr. Ladona Ridgel.  This study demonstrated a manifest left lateral accessory pathway.  There was no evidence of inducible SVT.  The patient's pathway was potentially life threatening with condition down to 230 milliseconds.  After modification of the path way following ablation, accessory pathway conduction terminated completely at 470 milliseconds.  The patient had no early apparent complications.  BRIEF HISTORY OF PRESENT ILLNESS:  [name] is a 65 year old male with history of palpitations and presyncope.  He presented to the emergency room earlier this month with these symptoms.  Telemetry at that time demonstrated frequent runs of supraventricular tachycardia and frequent PVCs.  The patient was evaluated by Dr. Ladona Ridgel with Electrophysiology and the patient was started on metoprolol 25 mg twice daily with close outpatient followup.  He was evaluated in the office with persisting tachycardia.  Because of these things, he was referred for radiofrequency ablation.  Risks, benefits, and  alternatives of the procedure were discussed with the patient, he wished to proceed.  HOSPITAL COURSE:  The patient was admitted on April 20, 2011 for planned ablation of his SVT.  This was carried out by Dr. Ladona Ridgel with details outlined above.  He was monitored on telemetry overnight which demonstrated sinus rhythm.  EKG morning after procedure demonstrated sinus rhythm with sinus arrhythmia with a rate of 77 and intervals of 0.14/0.09/0.45.  The patient's groin and neck incisions were without hematoma or bruit.  Wound care and activity restrictions were reviewed with the patient.  The patient was seen and examined by Dr. Johney Frame and considered stable for discharge.  FOLLOWUP APPOINTMENTS: 1. Dr. Ladona Ridgel on June 14, 2011 at 11:45 a.m. 2. Dr. Anne Fu as scheduled. 3. Dr. Lenise Arena as scheduled.  DISCHARGE INSTRUCTIONS: 1. Increase activity slowly. 2. No driving for 2 days. 3. Follow low-sodium heart-healthy diet. 4. Keep incisions clean and dry.  DISCHARGE MEDICATIONS: 1. Metoprolol 25 mg 1 tablet twice daily. 2. Vitamin B12 2 tablets 3 times weekly. 3. Tylenol 500 mg 2 tablets every 6 hours as needed for pain. 4. Simvastatin 20 mg daily. 5. MaxiVision eye vitamin 1 tablet daily at bedtime. 6. Multivitamin daily. 7. Flonase nasal spray, 1 spray daily. 8. Aspirin 81 mg daily. 9. Benicar 20 mg daily.  DISPOSITION:  The  patient was seen and examined by Dr. Johney Frame on April 21, 2011 and considered stable for discharge.  PATIENT DISCHARGE ENCOUNTER:  35 minutes.     Gypsy Balsam, RN,BSN   ______________________________ Hillis Range, MD    AS/MEDQ  D:  04/21/2011  T:  04/21/2011  Job:  [id number]  cc:   Jake Bathe, MD Joycelyn Rua, M.D.  Electronically Signed by Gypsy Balsam RNBSN on 05/15/2011 11:05:04 AM Electronically Signed by Hillis Range MD on 05/19/2011 05:38:45 PM

## 2011-06-01 ENCOUNTER — Encounter: Payer: Self-pay | Admitting: Internal Medicine

## 2011-06-02 ENCOUNTER — Encounter: Payer: Self-pay | Admitting: Internal Medicine

## 2011-06-02 ENCOUNTER — Ambulatory Visit (INDEPENDENT_AMBULATORY_CARE_PROVIDER_SITE_OTHER): Payer: 59 | Admitting: Internal Medicine

## 2011-06-02 VITALS — BP 118/76 | HR 70 | Resp 16 | Ht 67.0 in | Wt 185.0 lb

## 2011-06-02 DIAGNOSIS — R002 Palpitations: Secondary | ICD-10-CM

## 2011-06-02 DIAGNOSIS — I471 Supraventricular tachycardia: Secondary | ICD-10-CM

## 2011-06-02 NOTE — Assessment & Plan Note (Signed)
The patient has had palpitations but no documented arrhythmias. His symptoms occur daily. I suspect these are secondary to premature ventricular beats. I recommended that he wear a 48-hour Holter to confirm that his symptoms are related to PVCs and not due to recurrent SVT or pre-excited atrial fibrillation.

## 2011-06-02 NOTE — Patient Instructions (Signed)
Will schedule follow up for patient depending on results of monitor  Your physician has recommended that you wear a holter monitor. Holter monitors are medical devices that record the heart's electrical activity. Doctors most often use these monitors to diagnose arrhythmias. Arrhythmias are problems with the speed or rhythm of the heartbeat. The monitor is a small, portable device. You can wear one while you do your normal daily activities. This is usually used to diagnose what is causing palpitations/syncope (passing out).---48 hour---Palpitations

## 2011-06-02 NOTE — Progress Notes (Signed)
HPI [name] returns today for followup. He has a history of WPW syndrome with symptoms. He underwent electrophysiologic study and catheter ablation several weeks ago. Prior to his ablation, he was found to have a very rapidly conducting antegrade accessory pathway. The patient's pathway was modified although it could not be completely eliminated. Following ablation, the antegrade conduction over the pathway was very slow. Additional attempts at complete ablation were unsuccessful. The patient has had recurrent palpitations since his ablation procedure. He has minimal ventricular preexcitation on his electrocardiogram. The patient states that his symptoms last for seconds. They're often related to exertion. He has had no documented arrhythmias since his ablation. He did have several EKGs which demonstrated PVCs. He has not had syncope. Allergies  Allergen Reactions  . Lisinopril Cough     Current Outpatient Prescriptions  Medication Sig Dispense Refill  . acetaminophen (TYLENOL) 500 MG tablet Take 1,000 mg by mouth every 6 (six) hours as needed.        Marland Kitchen aspirin 81 MG tablet Take 81 mg by mouth daily.        Marland Kitchen BENICAR 20 MG tablet Take 1 tablet by mouth Daily.      . Cyanocobalamin (VITAMIN B 12 PO) Take 2,500 mg by mouth. 2 tablets 3 days a week      . fluticasone (FLONASE) 50 MCG/ACT nasal spray 1 spray by Nasal route Daily. Each nostril      . multivitamin (THERAGRAN) per tablet Take 1 tablet by mouth daily.        . NON FORMULARY Max vision eye vitamin. 1 tablet daily       . simvastatin (ZOCOR) 20 MG tablet Take 1 tablet by mouth At bedtime as needed.      Marland Kitchen DISCONTD: metoprolol tartrate (LOPRESSOR) 25 MG tablet Take one tablet by mouth two times a day, you can take an extra 25mg  daily as needed for palpitations         Past Medical History  Diagnosis Date  . Hyperlipidemia   . Hypertension   . Chronic back pain   . Seasonal allergies   . Aortic regurgitation     moderate  .  Wolff-Parkinson-White syndrome   . Arthritis     ROS:   All systems reviewed and negative except as noted in the HPI.   Past Surgical History  Procedure Date  . Cholecystectomy   . Appendectomy   . Knee surgery   . Cervical spine surgery      Family History  Problem Relation Age of Onset  . Other Mother   . Stroke Father 34    atherosclerosis possible of his heart     History   Social History  . Marital Status: Married    Spouse Name: N/A    Number of Children: 2  . Years of Education: N/A   Occupational History  . Not on file.   Social History Main Topics  . Smoking status: Former Smoker    Quit date: 12/25/2002  . Smokeless tobacco: Not on file  . Alcohol Use: No  . Drug Use: No  . Sexually Active: Not on file   Other Topics Concern  . Not on file   Social History Narrative  . No narrative on file     BP 118/76  Pulse 70  Resp 16  Ht 5\' 7"  (1.702 m)  Wt 185 lb (83.915 kg)  BMI 28.97 kg/m2  Physical Exam:  Well appearing NAD HEENT: Unremarkable Neck:  No  JVD, no thyromegally Lymphatics:  No adenopathy Back:  No CVA tenderness Lungs:  Clear no wheezes, rales, or rhonchi. HEART:  Regular rate rhythm, no murmurs, no rubs, no clicks Abd:  Flat, positive bowel sounds, no organomegally, no rebound, no guarding Ext:  2 plus pulses, no edema, no cyanosis, no clubbing Skin:  No rashes no nodules Neuro:  CN II through XII intact, motor grossly intact  EKG Normal sinus rhythm with minimal ventricular preexcitation.  Assess/Plan:

## 2011-06-09 ENCOUNTER — Encounter (INDEPENDENT_AMBULATORY_CARE_PROVIDER_SITE_OTHER): Payer: 59

## 2011-06-09 DIAGNOSIS — R002 Palpitations: Secondary | ICD-10-CM

## 2011-06-14 ENCOUNTER — Encounter: Payer: 59 | Admitting: Internal Medicine

## 2011-06-16 ENCOUNTER — Telehealth: Payer: Self-pay | Admitting: Internal Medicine

## 2011-06-16 NOTE — Telephone Encounter (Signed)
Test result

## 2011-06-16 NOTE — Telephone Encounter (Signed)
Spoke with patient adding him on to schedule on Tues at 10am

## 2011-06-20 ENCOUNTER — Ambulatory Visit (INDEPENDENT_AMBULATORY_CARE_PROVIDER_SITE_OTHER): Payer: 59 | Admitting: Internal Medicine

## 2011-06-20 ENCOUNTER — Encounter: Payer: Self-pay | Admitting: Internal Medicine

## 2011-06-20 DIAGNOSIS — I471 Supraventricular tachycardia: Secondary | ICD-10-CM

## 2011-06-20 DIAGNOSIS — R0789 Other chest pain: Secondary | ICD-10-CM

## 2011-06-20 MED ORDER — FLECAINIDE ACETATE 50 MG PO TABS: 50.0000 mg | ORAL_TABLET | Freq: Two times a day (BID) | ORAL | Status: DC

## 2011-06-20 NOTE — Patient Instructions (Signed)
Your physician recommends that you schedule a follow-up appointment in: 4 weeks with Dr Ladona Ridgel   Your physician has recommended you make the following change in your medication: start Flecainide 50mg  twice daily

## 2011-06-21 ENCOUNTER — Telehealth: Payer: Self-pay | Admitting: Internal Medicine

## 2011-06-21 NOTE — Telephone Encounter (Signed)
Spoke with patient  He is not sure if he is going to take the medication or not  I have explained to him the importance of starting Flecainide and told him to give it some thought and call us back and let us know.  He states he has felt really good the last week or so

## 2011-06-21 NOTE — Telephone Encounter (Signed)
Pt was in yesterday and had questions re flecinide, wants to know the risk to him if he doesn't take it?

## 2011-06-22 ENCOUNTER — Encounter: Payer: Self-pay | Admitting: Internal Medicine

## 2011-06-22 NOTE — Assessment & Plan Note (Signed)
He has continued to have palpitations. I have discussed with him the treatment options. Ablation is an options but he would still have both atrial and ventricular ectopy. I have recommended he start flecainide. Initially will try low dose at 50 mg twice daily though I suspect he will require more to suppress his symptoms.

## 2011-06-22 NOTE — Progress Notes (Signed)
HPI Mr. Farruggia returns today for followup. He is a pleasant 65 yo man with a h/o palpitations and WPW syndrome. He underwent catheter ablation where his left lateral accessory pathway was modified but could not be successfully ablated. He has had recurrent palpitations and when I saw him several weeks ago, I recommended he wear a cardiac monitor. This had demonstrated multiple arrhythmias including frequent PVC's and PAC's. He also has non-sustained SVT with intermittant abherration. He has not had syncope. He does feel poorly for seconds at a time but does not have much in the way of palpitations. Allergies  Allergen Reactions  . Lisinopril Cough     Current Outpatient Prescriptions  Medication Sig Dispense Refill  . acetaminophen (TYLENOL) 500 MG tablet Take 1,000 mg by mouth every 6 (six) hours as needed.        Marland Kitchen aspirin 81 MG tablet Take 81 mg by mouth daily.        Marland Kitchen BENICAR 20 MG tablet Take 1 tablet by mouth Daily.      . Cyanocobalamin (VITAMIN B 12 PO) Take 2,500 mg by mouth. 2 tablets 3 days a week      . fluticasone (FLONASE) 50 MCG/ACT nasal spray 1 spray by Nasal route Daily. Each nostril      . multivitamin (THERAGRAN) per tablet Take 1 tablet by mouth daily.        . NON FORMULARY Max vision eye vitamin. 1 tablet daily       . simvastatin (ZOCOR) 20 MG tablet Take 1 tablet by mouth At bedtime as needed.      . flecainide (TAMBOCOR) 50 MG tablet Take 1 tablet (50 mg total) by mouth 2 (two) times daily.  60 tablet  3     Past Medical History  Diagnosis Date  . Hyperlipidemia   . Hypertension   . Chronic back pain   . Seasonal allergies   . Aortic regurgitation     moderate  . Wolff-Parkinson-White syndrome   . Arthritis     ROS:   All systems reviewed and negative except as noted in the HPI.   Past Surgical History  Procedure Date  . Cholecystectomy   . Appendectomy   . Knee surgery   . Cervical spine surgery      Family History  Problem Relation Age of  Onset  . Other Mother   . Stroke Father 40    atherosclerosis possible of his heart     History   Social History  . Marital Status: Married    Spouse Name: N/A    Number of Children: 2  . Years of Education: N/A   Occupational History  . Not on file.   Social History Main Topics  . Smoking status: Former Smoker    Quit date: 12/25/2002  . Smokeless tobacco: Not on file  . Alcohol Use: No  . Drug Use: No  . Sexually Active: Not on file   Other Topics Concern  . Not on file   Social History Narrative  . No narrative on file     BP 121/73  Pulse 58  Resp 16  Ht 5\' 7"  (1.702 m)  Wt 186 lb (84.369 kg)  BMI 29.13 kg/m2  Physical Exam:  Well appearing NAD HEENT: Unremarkable Neck:  No JVD, no thyromegally Lymphatics:  No adenopathy Back:  No CVA tenderness Lungs:  Clear HEART:  Regular rate rhythm, no murmurs, no rubs, no clicks Abd:  soft, positive bowel sounds, no organomegally,  no rebound, no guarding Ext:  2 plus pulses, no edema, no cyanosis, no clubbing Skin:  No rashes no nodules Neuro:  CN II through XII intact, motor grossly intact  Assess/Plan:

## 2011-06-22 NOTE — Assessment & Plan Note (Signed)
This is related to his SVT. He does not have CAD.

## 2011-07-18 ENCOUNTER — Ambulatory Visit: Payer: 59 | Admitting: Internal Medicine

## 2013-10-01 ENCOUNTER — Other Ambulatory Visit: Payer: Self-pay | Admitting: Neurosurgery

## 2013-10-01 DIAGNOSIS — M5010 Cervical disc disorder with radiculopathy, unspecified cervical region: Secondary | ICD-10-CM

## 2013-10-01 DIAGNOSIS — M545 Low back pain, unspecified: Secondary | ICD-10-CM

## 2013-10-09 ENCOUNTER — Ambulatory Visit
Admission: RE | Admit: 2013-10-09 | Discharge: 2013-10-09 | Disposition: A | Discharge status: Home / Self Care | Payer: BC Managed Care – PPO | Source: Ambulatory Visit | Attending: Neurosurgery | Admitting: Neurosurgery

## 2013-10-09 DIAGNOSIS — M545 Low back pain, unspecified: Secondary | ICD-10-CM

## 2013-10-09 DIAGNOSIS — M5010 Cervical disc disorder with radiculopathy, unspecified cervical region: Secondary | ICD-10-CM

## 2013-10-21 ENCOUNTER — Encounter: Payer: Self-pay | Admitting: Cardiology

## 2013-10-23 ENCOUNTER — Encounter: Payer: Self-pay | Admitting: Cardiology

## 2013-10-23 ENCOUNTER — Ambulatory Visit (INDEPENDENT_AMBULATORY_CARE_PROVIDER_SITE_OTHER): Payer: BC Managed Care – PPO | Admitting: Cardiology

## 2013-10-23 VITALS — BP 118/78 | HR 62 | Ht 67.0 in | Wt 184.0 lb

## 2013-10-23 DIAGNOSIS — E782 Mixed hyperlipidemia: Secondary | ICD-10-CM

## 2013-10-23 DIAGNOSIS — I471 Supraventricular tachycardia, unspecified: Secondary | ICD-10-CM

## 2013-10-23 DIAGNOSIS — I10 Essential (primary) hypertension: Secondary | ICD-10-CM

## 2013-10-23 NOTE — Patient Instructions (Signed)
Your physician wants you to follow-up in: 1 year with Dr. Skains You will receive a reminder letter in the mail two months in advance. If you don't receive a letter, please call our office to schedule the follow-up appointment.  Your physician recommends that you continue on your current medications as directed. Please refer to the Current Medication list given to you today.  

## 2013-10-23 NOTE — Progress Notes (Signed)
[address] [place], [place]  [address] Phone: [phone] Fax:  [phone]  Date:  10/23/2013   ID:  [name], DOB 04-18-1946, MRN [id number]  PCP:  Joycelyn Rua, MD   History of Present Illness: [name] is a 67 y.o. male  with WPW status post ablation by Dr. Ladona Ridgel on 04/20/11. Ejection fraction normal.hyperlipidemia on simvastatin. Hypertension on Losartan.  He was in the emergency room once again on May 17, 2011 for symptoms of lightheadedness dizziness his head felt like it was pounding. He felt like his heart was fluttering. He had pain in his left jaw and under his left shoulder blade. His heart rate was effectively in the 40s. He was told to taper off his metoprolol. He was on metoprolol tartrate 12.5 g twice a day at the time. Hemoglobin was normal head CT was done which was unremarkable. EKG showed PVCs. Otherwise unremarkable. Dr. Maryelizabeth Kaufmann saw in the emergency department.  Both he and his wife reminded me that the ablation did not result in complete termination of accessory pathway. The dictated discharge summary appears different.  Previously in church had rapid heart rate went away in a few hours. Had irreg beats all week. Dr. Ladona Ridgel talked about medical therapy and I believe Flecainide use. He was hesitant to use this medication because of side effect profile. He has been doing OK without.   Patient denies significant palpitations, syncope, swelling, nor PND.   Wt Readings from Last 3 Encounters:  10/23/13 184 lb (83.462 kg)  06/20/11 186 lb (84.369 kg)  06/02/11 185 lb (83.915 kg)     Past Medical History  Diagnosis Date  . Hyperlipidemia   . Hypertension   . Chronic back pain   . Seasonal allergies   . Aortic regurgitation     moderate  . Wolff-Parkinson-White syndrome   . Arthritis     Past Surgical History  Procedure Laterality Date  . Cholecystectomy    . Appendectomy    . Knee surgery    . Cervical spine surgery        Current Outpatient Prescriptions  Medication Sig Dispense Refill  . acetaminophen (TYLENOL) 500 MG tablet Take 1,000 mg by mouth every 6 (six) hours as needed.        Marland Kitchen aspirin 81 MG tablet Take 81 mg by mouth daily.       Marland Kitchen losartan (COZAAR) 50 MG tablet Take 50 mg by mouth daily.      . multivitamin (THERAGRAN) per tablet Take 1 tablet by mouth daily.        . NON FORMULARY Max vision eye vitamin. 1 tablet daily       . simvastatin (ZOCOR) 20 MG tablet Take 1 tablet by mouth At bedtime as needed.      . flecainide (TAMBOCOR) 50 MG tablet Take 1 tablet (50 mg total) by mouth 2 (two) times daily.  60 tablet  3   No current facility-administered medications for this visit.    Allergies:    Allergies  Allergen Reactions  . Lisinopril Cough    Social History:  The patient  reports that he quit smoking about 10 years ago. He does not have any smokeless tobacco history on file. He reports that he does not drink alcohol or use illicit drugs.   ROS:  Please see the history of present illness.   Denies bleeding, syncope, orthopnea, PND    PHYSICAL EXAM: VS:  BP  118/78  Pulse 62  Ht 5\' 7"  (1.702 m)  Wt 184 lb (83.462 kg)  BMI 28.81 kg/m2 Well nourished, well developed, in no acute distress HEENT: normal Neck: no JVD Cardiac:  normal S1, S2; RRR; no murmur, occasional ectopy Lungs:  clear to auscultation bilaterally, no wheezing, rhonchi or rales Abd: soft, nontender, no hepatomegaly Ext: no edema Skin: warm and dry Neuro: no focal abnormalities noted  EKG:  Sinus rhythm rate 62 with frequent PVCs     ASSESSMENT AND PLAN:  1. WPW-currently asymptomatic. Prior ablative procedure. Is not currently on any suppressive medications for palpitations. 2. PVCs-doing well. Asymptomatic at this point. 3. Hypertension-good overall control. Medications reviewed. 4. Hyperlipidemia-continuing with statin therapy. No changes made.  Signed, Donato Schultz, MD [facility]  10/23/2013 9:02 AM

## 2013-12-09 ENCOUNTER — Telehealth: Payer: Self-pay | Admitting: Cardiology

## 2013-12-09 NOTE — Telephone Encounter (Signed)
New problem    Pt had a request for [employer] to [employer] Simas denied for heart issues.  Pt sent a letter requesting a detailed reason from Ins co. But the letter was sent to Dr Anne Fu office.   Pt would like the letter mailed to him please.   Give pt a call with any questions.

## 2013-12-12 NOTE — Telephone Encounter (Signed)
Left message for patient to call the office

## 2013-12-12 NOTE — Telephone Encounter (Signed)
Spoke with patient advised that we haven't received and insurance letter for him in the office.

## 2013-12-12 NOTE — Telephone Encounter (Signed)
Follow up     Pt returning our call,  please call his CELL PHONE on return calls.    Thank you!

## 2014-07-22 ENCOUNTER — Telehealth: Payer: Self-pay | Admitting: Cardiology

## 2014-07-22 DIAGNOSIS — R001 Bradycardia, unspecified: Secondary | ICD-10-CM

## 2014-07-22 NOTE — Telephone Encounter (Signed)
New Message  Pt called request a call back. Reports is heart rate is extremly low. No readings. Requests a call back to discuss.

## 2014-07-22 NOTE — Telephone Encounter (Signed)
Spoke with pt who states he has noticed his HR being some what slower lately - between 48 to 50 BPM.  He denies any complaints - no increased SOB no dizziness etc.  He would like to know 1) if he should be concerned and 2) he is scheduled for a colonoscopy Thursday of next week and needs to know if it is safe.  Advised without an EKG it is hard to know what his true HR and rhythm are.  He could be having PVCs etc and just not perfusing in order to feel a pulse accurately.  He will continue to monitor as again he is not having any s/s.  Advised it should be fine to continue on with the colonoscopy as planned but that I will forward information to Dr Marlou Porch for review and recommendations.  Pt's last documented HR at office was was 62.  Aware I will call him back when I hear back from Dr Marlou Porch.

## 2014-07-23 NOTE — Telephone Encounter (Signed)
I would like him to wear a 24 hour holter monitor. In review of Dr. Tanna Furry note. Flecanide may need to be increased to suppress PVC, PAC's. Let's get a good idea with monitor.  Candee Furbish, MD

## 2014-07-24 ENCOUNTER — Encounter: Payer: Self-pay | Admitting: Radiology

## 2014-07-24 ENCOUNTER — Encounter (INDEPENDENT_AMBULATORY_CARE_PROVIDER_SITE_OTHER): Payer: BC Managed Care – PPO

## 2014-07-24 DIAGNOSIS — R001 Bradycardia, unspecified: Secondary | ICD-10-CM

## 2014-07-24 DIAGNOSIS — I498 Other specified cardiac arrhythmias: Secondary | ICD-10-CM

## 2014-07-24 NOTE — Telephone Encounter (Signed)
Pt was scheduled for today

## 2014-07-24 NOTE — Progress Notes (Signed)
Patient ID: [name], male   DOB: October 09, 1946, 68 y.o.   MRN: [id number] E cardio 24hr holter applied

## 2014-07-24 NOTE — Telephone Encounter (Signed)
Pt aware of orders to wear a 24 hr heart monitor.  He will be expecting a call to get scheduled

## 2014-07-29 ENCOUNTER — Telehealth: Payer: Self-pay | Admitting: *Deleted

## 2014-07-29 DIAGNOSIS — I493 Ventricular premature depolarization: Secondary | ICD-10-CM

## 2014-07-29 MED ORDER — FLECAINIDE ACETATE 50 MG PO TABS: 50.0000 mg | ORAL_TABLET | Freq: Two times a day (BID) | ORAL | Status: DC

## 2014-07-29 NOTE — Telephone Encounter (Signed)
Spoke with patient about monitor results and need to start Flecainide 50 mg one twice a day.  He is agreeable.  Rx will be sent into [facility] on [address].  Pt will need a GXT in 2 weeks - if not possible to schedule for within 2 weeks will need GXT.

## 2014-08-17 ENCOUNTER — Encounter: Payer: BC Managed Care – PPO | Admitting: Physician Assistant

## 2014-08-24 ENCOUNTER — Ambulatory Visit (INDEPENDENT_AMBULATORY_CARE_PROVIDER_SITE_OTHER): Payer: BC Managed Care – PPO | Admitting: Physician Assistant

## 2014-08-24 DIAGNOSIS — I493 Ventricular premature depolarization: Secondary | ICD-10-CM

## 2014-08-24 DIAGNOSIS — I4949 Other premature depolarization: Secondary | ICD-10-CM

## 2014-08-24 NOTE — Progress Notes (Signed)
Exercise Treadmill Test  Pre-Exercise Testing Evaluation Rhythm: normal sinus  Rate: 77     Test  Exercise Tolerance Test Ordering MD: Candee Furbish, MD  Interpreting MD: Richardson Dopp, PA-C  Unique Test No: 1  Treadmill:  1  Indication for ETT: PVC's, Medication Management (Flecainide), WPW s/p ablation  Contraindication to ETT: No   Stress Modality: exercise - treadmill  Cardiac Imaging Performed: non   Protocol: standard Bruce - maximal  Max BP:  222/97  Max MPHR (bpm):  153 85% MPR (bpm):  130  MPHR obtained (bpm):  136 % MPHR obtained:  89  Reached 85% MPHR (min:sec):  5:18 Total Exercise Time (min-sec):  5:48  Workload in METS:  7.0 Borg Scale: 17  Reason ETT Terminated:  exaggerated hypertensive response    ST Segment Analysis At Rest: normal ST segments - no evidence of significant ST depression With Exercise: no evidence of significant ST depression  Other Information Arrhythmia:  No Angina during ETT:  absent (0) Quality of ETT:  diagnostic  ETT Interpretation:  normal - no evidence of ischemia by ST analysis  Comments: Fair exercise capacity. No chest pain. Patient had significant back pain related to DJD (back limited exercise). Exaggerated hypertensive BP response to exercise (probably exacerbated by back pain). No ST changes to suggest ischemia.   No exercise induced ventricular arrhythmias.  Recommendations: Negative for pro-arrhythmia. PVCs improved on Flecainide. Continue current Rx. FU with Dr. Candee Furbish as planned.  Signed,  Richardson Dopp, PA-C   08/24/2014 11:22 AM

## 2014-09-14 ENCOUNTER — Telehealth: Payer: Self-pay | Admitting: Cardiology

## 2014-09-14 NOTE — Telephone Encounter (Signed)
WILL FORWARD TO  DR Marlou Porch  FOR  REVIEW   PROCEDURE   NOT  SCHEDULED AS OF  YET .Adonis Housekeeper

## 2014-09-14 NOTE — Telephone Encounter (Signed)
New problem   Need cardiac clearance for pt to have colonoscopy. Please advise.

## 2014-09-15 NOTE — Telephone Encounter (Signed)
LMTCB ./CY 

## 2014-09-15 NOTE — Telephone Encounter (Signed)
He may proceed with colonoscopy. Phillip Furbish, MD

## 2014-09-18 NOTE — Telephone Encounter (Signed)
LMTCB ./CY 

## 2014-09-18 NOTE — Telephone Encounter (Signed)
SHANNON  NOTIFIED  PT  MAY PROCEED  WITH  COLONOSCOPY .Adonis Housekeeper

## 2014-10-26 ENCOUNTER — Ambulatory Visit (INDEPENDENT_AMBULATORY_CARE_PROVIDER_SITE_OTHER): Payer: BC Managed Care – PPO | Admitting: Cardiology

## 2014-10-26 ENCOUNTER — Encounter: Payer: Self-pay | Admitting: Cardiology

## 2014-10-26 VITALS — BP 128/82 | HR 76 | Ht 67.0 in | Wt 197.0 lb

## 2014-10-26 DIAGNOSIS — E669 Obesity, unspecified: Secondary | ICD-10-CM | POA: Insufficient documentation

## 2014-10-26 DIAGNOSIS — I471 Supraventricular tachycardia: Secondary | ICD-10-CM

## 2014-10-26 DIAGNOSIS — R0789 Other chest pain: Secondary | ICD-10-CM

## 2014-10-26 DIAGNOSIS — I10 Essential (primary) hypertension: Secondary | ICD-10-CM

## 2014-10-26 DIAGNOSIS — I493 Ventricular premature depolarization: Secondary | ICD-10-CM | POA: Insufficient documentation

## 2014-10-26 NOTE — Progress Notes (Signed)
[facility], [address], [place]  [address] Phone: [phone] Fax:  [phone]  Date:  10/26/2014   ID:  [name], DOB March 22, 1946, MRN [id number]  PCP:  Phillip Shelling, MD   History of Present Illness: [name] is a 68 y.o. male  with WPW status post ablation by Dr. Lovena Le on 04/20/11 now taking low-dose fleck 950 mg twice a day for frequent PVCs detected on Holter monitor in July 2015. Ejection fraction normal. Hyperlipidemia on simvastatin. Hypertension on Losartan.  He was in the emergency room once on May 17, 2011 for symptoms of lightheadedness dizziness his head felt like it was pounding. He felt like his heart was fluttering. He had pain in his left jaw and under his left shoulder blade. His heart rate was effectively in the 40s. He was told to taper off his metoprolol. He was on metoprolol tartrate 12.5 g twice a day at the time. Hemoglobin was normal head CT was done which was unremarkable. EKG showed PVCs. Otherwise unremarkable. Dr. Nira Retort saw in the emergency department.  Both he and his wife previously reminded me that the ablation did not result in complete termination of accessory pathway. The dictated discharge summary appears different.  Previously in church had rapid heart rate went away in a few hours. Had irreg beats all week. Dr. Lovena Le talked about medical therapy and I believe Flecainide use. He was hesitant to use this medication because of side effect profile. After noting more palpitations and frequent PVCs, he did start 04/02/1948 grams twice a day and he states that this is helping him. He does feel some more "gas "this medication.  Patient denies significant palpitations, syncope, swelling, nor PND.   Wt Readings from Last 3 Encounters:  10/26/14 197 lb (89.359 kg)  10/23/13 184 lb (83.462 kg)  06/20/11 186 lb (84.369 kg)     Past Medical History  Diagnosis Date  . Hyperlipidemia   . Hypertension   . Chronic back pain     . Seasonal allergies   . Aortic regurgitation     moderate  . Wolff-Parkinson-White syndrome   . Arthritis     Past Surgical History  Procedure Laterality Date  . Cholecystectomy    . Appendectomy    . Knee surgery    . Cervical spine surgery      Current Outpatient Prescriptions  Medication Sig Dispense Refill  . acetaminophen (TYLENOL) 500 MG tablet Take 1,000 mg by mouth every 6 (six) hours as needed.      Marland Kitchen aspirin 81 MG tablet Take 81 mg by mouth daily.     . flecainide (TAMBOCOR) 50 MG tablet Take 1 tablet (50 mg total) by mouth 2 (two) times daily. 60 tablet 11  . FLUZONE HIGH-DOSE 0.5 ML SUSY   0  . losartan (COZAAR) 50 MG tablet Take 50 mg by mouth daily.    . multivitamin (THERAGRAN) per tablet Take 1 tablet by mouth daily.      . NON FORMULARY Max vision eye vitamin. 1 tablet daily     . simvastatin (ZOCOR) 20 MG tablet Take 1 tablet by mouth At bedtime as needed.     No current facility-administered medications for this visit.    Allergies:    Allergies  Allergen Reactions  . Lisinopril Cough    Social History:  The patient  reports that he quit smoking about 11 years ago. He does not have any smokeless tobacco history  on file. He reports that he does not drink alcohol or use illicit drugs.   ROS:  Please see the history of present illness.   Denies bleeding, syncope, orthopnea, PND    PHYSICAL EXAM: VS:  BP 128/82 mmHg  Pulse 76  Ht 5\' 7"  (1.702 m)  Wt 197 lb (89.359 kg)  BMI 30.85 kg/m2 Well nourished, well developed, in no acute distress HEENT: normal Neck: no JVD Cardiac:  normal S1, S2; RRR; no murmur, occasional ectopy Lungs:  clear to auscultation bilaterally, no wheezing, rhonchi or rales Abd: soft, nontender, no hepatomegaly, obese Ext: no edema Skin: warm and dry Neuro: no focal abnormalities noted  EKG:  10/26/14 - Sinus rhythm rate 75 with frequent PVCs   (4 on 12 lead)ventricular trigeminy Holter monitor 7/15-frequent PVCs (lowering  affective heart rate) Exercise treadmill test: 08/24/14-after starting flecainide-doing well. Exaggerated blood pressure response.  ASSESSMENT AND PLAN:  1. WPW-currently asymptomatic. Prior ablative procedure. Is not currently on any suppressive medications for palpitations. 2. PVCs-doing well on low-dose flecainide. There is still present on EKG. Asymptomatic at this point.exercise treadmill test reviewed after beginning this medication and normal without any ischemic changes or ventricular tachycardia. 3. Hypertension-good overall control. Medications reviewed.he did have a exaggerated blood pressure response to exercise. 4. Obesity-encourage weight loss. 5. Hyperlipidemia-continuing with statin therapy. No changes made. 6. 6 month follow-up  Signed, Candee Furbish, MD [facility]  10/26/2014 8:42 AM

## 2014-10-26 NOTE — Patient Instructions (Signed)
The current medical regimen is effective;  continue present plan and medications.  Follow up in 6 months with Dr. Skains.  You will receive a letter in the mail 2 months before you are due.  Please call us when you receive this letter to schedule your follow up appointment.  

## 2014-11-02 ENCOUNTER — Other Ambulatory Visit: Payer: Self-pay | Admitting: Gastroenterology

## 2015-04-27 ENCOUNTER — Other Ambulatory Visit: Payer: Self-pay

## 2015-04-27 ENCOUNTER — Encounter: Payer: Self-pay | Admitting: Cardiology

## 2015-04-27 ENCOUNTER — Ambulatory Visit (INDEPENDENT_AMBULATORY_CARE_PROVIDER_SITE_OTHER): Payer: BLUE CROSS/BLUE SHIELD | Admitting: Cardiology

## 2015-04-27 VITALS — BP 130/82 | HR 71 | Ht 67.0 in | Wt 194.0 lb

## 2015-04-27 DIAGNOSIS — Z79899 Other long term (current) drug therapy: Secondary | ICD-10-CM

## 2015-04-27 DIAGNOSIS — I493 Ventricular premature depolarization: Secondary | ICD-10-CM

## 2015-04-27 DIAGNOSIS — R011 Cardiac murmur, unspecified: Secondary | ICD-10-CM | POA: Diagnosis not present

## 2015-04-27 DIAGNOSIS — I471 Supraventricular tachycardia: Secondary | ICD-10-CM

## 2015-04-27 MED ORDER — FLECAINIDE ACETATE 100 MG PO TABS: 100.0000 mg | ORAL_TABLET | Freq: Two times a day (BID) | ORAL | Status: DC

## 2015-04-27 NOTE — Progress Notes (Signed)
[facility]. [address], [address], [place]  [address] Phone: [phone] Fax:  [phone]  Date:  04/27/2015   ID:  [name], DOB 1946/02/15, MRN [id number]  PCP:  Irven Shelling, MD   History of Present Illness: [name] is a 69 y.o. male  with WPW status post ablation by Dr. Lovena Le on 04/20/11 now taking low-dose flecainide 50 mg twice a day for frequent PVCs detected on Holter monitor in July 2015. Ejection fraction normal. Hyperlipidemia on simvastatin. Hypertension on Losartan.  Recently he has been feeling more palpitations, PVCs. At first, the flecainide was working quite well. No chest pain, no syncope. Has long-standing heart murmur.   He is about to retire from [employer] since 1970.   Previously: He was in the emergency room once on May 17, 2011 for symptoms of lightheadedness dizziness his head felt like it was pounding. He felt like his heart was fluttering. He had pain in his left jaw and under his left shoulder blade. His heart rate was effectively in the 40s. He was told to taper off his metoprolol. He was on metoprolol tartrate 12.5 g twice a day at the time. Hemoglobin was normal head CT was done which was unremarkable. EKG showed PVCs. Otherwise unremarkable. Dr. Nira Retort saw in the emergency department.  Both he and his wife previously reminded me that the ablation did not result in complete termination of accessory pathway. The dictated discharge summary appears different.  Previously in church had rapid heart rate went away in a few hours. Had irreg beats all week. Dr. Lovena Le talked about medical therapy and I believe Flecainide use. He was hesitant to use this medication because of side effect profile. After noting more palpitations and frequent PVCs, he did start 04/02/1949 grams twice a day and he states that this is helping him. He does feel some more "gas "this medication.   Wt Readings from Last 3 Encounters:  04/27/15 194 lb  (87.998 kg)  10/26/14 197 lb (89.359 kg)  10/23/13 184 lb (83.462 kg)     Past Medical History  Diagnosis Date  . Hyperlipidemia   . Hypertension   . Chronic back pain   . Seasonal allergies   . Aortic regurgitation     moderate  . Wolff-Parkinson-White syndrome   . Arthritis     Past Surgical History  Procedure Laterality Date  . Cholecystectomy    . Appendectomy    . Knee surgery    . Cervical spine surgery      Current Outpatient Prescriptions  Medication Sig Dispense Refill  . acetaminophen (TYLENOL) 500 MG tablet Take 1,000 mg by mouth every 6 (six) hours as needed.      Marland Kitchen aspirin 81 MG tablet Take 81 mg by mouth daily.     . flecainide (TAMBOCOR) 50 MG tablet Take 1 tablet (50 mg total) by mouth 2 (two) times daily. 60 tablet 11  . FLUZONE HIGH-DOSE 0.5 ML SUSY   0  . losartan (COZAAR) 50 MG tablet Take 50 mg by mouth daily.    . multivitamin (THERAGRAN) per tablet Take 1 tablet by mouth daily.      . NON FORMULARY Max vision eye vitamin. 1 tablet daily     . simvastatin (ZOCOR) 20 MG tablet Take 1 tablet by mouth At bedtime as needed.     No current facility-administered medications for this visit.    Allergies:    Allergies  Allergen  Reactions  . Lisinopril Cough    Social History:  The patient  reports that he quit smoking about 12 years ago. He does not have any smokeless tobacco history on file. He reports that he does not drink alcohol or use illicit drugs.   ROS:  Please see the history of present illness.   Denies bleeding, syncope, orthopnea, PND    PHYSICAL EXAM: VS:  BP 130/82 mmHg  Pulse 71  Ht 5\' 7"  (1.702 m)  Wt 194 lb (87.998 kg)  BMI 30.38 kg/m2 Well nourished, well developed, in no acute distress HEENT: normal Neck: no JVD Cardiac:  normal S1, S2; RRR; 2/6 SEM RUSB, occasional ectopy Lungs:  clear to auscultation bilaterally, no wheezing, rhonchi or rales Abd: soft, nontender, no hepatomegaly, obese Ext: no edema Skin: warm and  dry Neuro: no focal abnormalities noted  EKG:  10/26/14 - Sinus rhythm rate 75 with frequent PVCs   (4 on 12 lead) ventricular trigeminy  Holter monitor 7/15-frequent PVCs (lowering affective heart rate)  Exercise treadmill test: 08/24/14-after starting flecainide-doing well. Exaggerated blood pressure response.  ASSESSMENT AND PLAN:  1. WPW-currently asymptomatic except for palpitations which are PVC related.. Prior ablative procedure.  2. PVCs-was previously doing well on low-dose flecainide.  He continues to have increasing frequency however. I will increase his flecainide to 100 mg twice a day. Asymptomatic at this point.exercise treadmill test reviewed after beginning this medication and normal without any ischemic changes or ventricular tachycardia. 3. Heart murmur-I will check echocardiogram. Aortic valve murmur since suspected. He states that he has had this since birth. 4. Hypertension-good overall control. Medications reviewed.he did have a exaggerated blood pressure response to exercise. 5. Obesity-encourage weight loss. 6. Hyperlipidemia-continuing with statin therapy. No changes made. 7. 6 month follow-up. In 2 weeks, check EKG.  Signed, Candee Furbish, MD [facility]  04/27/2015 8:07 AM

## 2015-04-27 NOTE — Patient Instructions (Signed)
Medication Instructions:  Please increase your Flecainide to 100 mg twice a day. Continue all other medications as listed  Labwork: None  Testing/Procedures: Your physician has requested that you have an echocardiogram in approximately 2 weeks. Echocardiography is a painless test that uses sound waves to create images of your heart. It provides your doctor with information about the size and shape of your heart and how well your heart's chambers and valves are working. This procedure takes approximately one hour. There are no restrictions for this procedure.  Please have an EKG in 2 weeks the same day you have your echocardiogram.   Follow-Up: Follow up in 6 months with Dr. Marlou Porch.  You will receive a letter in the mail 2 months before you are due.  Please call us when you receive this letter to schedule your follow up appointment.   Thank you for choosing [facility]!!

## 2015-05-11 ENCOUNTER — Other Ambulatory Visit: Payer: Self-pay

## 2015-05-11 ENCOUNTER — Ambulatory Visit (INDEPENDENT_AMBULATORY_CARE_PROVIDER_SITE_OTHER): Payer: BLUE CROSS/BLUE SHIELD

## 2015-05-11 ENCOUNTER — Ambulatory Visit (HOSPITAL_COMMUNITY): Payer: BLUE CROSS/BLUE SHIELD | Attending: Cardiovascular Disease

## 2015-05-11 VITALS — Ht 67.0 in | Wt 191.0 lb

## 2015-05-11 DIAGNOSIS — E785 Hyperlipidemia, unspecified: Secondary | ICD-10-CM | POA: Diagnosis not present

## 2015-05-11 DIAGNOSIS — I351 Nonrheumatic aortic (valve) insufficiency: Secondary | ICD-10-CM | POA: Insufficient documentation

## 2015-05-11 DIAGNOSIS — I493 Ventricular premature depolarization: Secondary | ICD-10-CM

## 2015-05-11 DIAGNOSIS — R011 Cardiac murmur, unspecified: Secondary | ICD-10-CM | POA: Insufficient documentation

## 2015-05-11 DIAGNOSIS — I10 Essential (primary) hypertension: Secondary | ICD-10-CM | POA: Insufficient documentation

## 2015-05-11 NOTE — Patient Instructions (Signed)
NO CHANGES AT THIS TIME 

## 2015-05-11 NOTE — Progress Notes (Signed)
1.) Reason for visit:  EKG  2.) Name of MD requesting visit: DR. Marlou Porch  3.) H&P: Patient has history of increasing PVC's on pervious office visits. Patient's flecainide was increased to 100 mg twice daily after last visit.  4.) ROS related to problem: Patient's EKG showed normal sinus rhythm with no PVC's.   5.) Assessment and plan per MD: Dr. Lovena Le reviewed EKG. No changes at this time. Continue on current treatment plan.

## 2015-07-28 ENCOUNTER — Ambulatory Visit
Admission: RE | Admit: 2015-07-28 | Discharge: 2015-07-28 | Disposition: A | Discharge status: Home / Self Care | Payer: PPO | Source: Ambulatory Visit | Attending: Internal Medicine | Admitting: Internal Medicine

## 2015-07-28 ENCOUNTER — Other Ambulatory Visit: Payer: Self-pay | Admitting: Internal Medicine

## 2015-07-28 DIAGNOSIS — M549 Dorsalgia, unspecified: Secondary | ICD-10-CM

## 2015-11-24 ENCOUNTER — Encounter: Payer: Self-pay | Admitting: Cardiology

## 2015-11-24 ENCOUNTER — Ambulatory Visit (INDEPENDENT_AMBULATORY_CARE_PROVIDER_SITE_OTHER): Payer: PPO | Admitting: Cardiology

## 2015-11-24 VITALS — BP 116/82 | HR 73 | Ht 67.0 in | Wt 187.1 lb

## 2015-11-24 DIAGNOSIS — I10 Essential (primary) hypertension: Secondary | ICD-10-CM

## 2015-11-24 DIAGNOSIS — I471 Supraventricular tachycardia: Secondary | ICD-10-CM

## 2015-11-24 DIAGNOSIS — I493 Ventricular premature depolarization: Secondary | ICD-10-CM

## 2015-11-24 NOTE — Progress Notes (Addendum)
[place]. [address], [place]  [address] Phone: [phone] Fax:  [phone]  Date:  11/24/2015   ID:  [name], DOB 1946/01/26, MRN [id number]  PCP:  Irven Shelling, MD   History of Present Illness: [name] is a 69 y.o. male  with WPW status post ablation by Dr. Lovena Le on 04/20/11 now taking low-dose flecainide 50 mg twice a day for frequent PVCs detected on Holter monitor in July 2015. Ejection fraction normal. Hyperlipidemia on simvastatin. Hypertension on Losartan.  Recently he has been feeling more palpitations, PVCs. At first, the flecainide was working quite well. No chest pain, no syncope. Has long-standing heart murmur.   He is about to retire from [employer] since 1970.   Previously: He was in the emergency room once on May 17, 2011 for symptoms of lightheadedness dizziness his head felt like it was pounding. He felt like his heart was fluttering. He had pain in his left jaw and under his left shoulder blade. His heart rate was effectively in the 40s. He was told to taper off his metoprolol. He was on metoprolol tartrate 12.5 mg twice a day at the time. Hemoglobin was normal head CT was done which was unremarkable. EKG showed PVCs. Otherwise unremarkable. Dr. Nira Retort saw in the emergency department.  Both he and his wife previously reminded me that the ablation did not result in complete termination of accessory pathway. The dictated discharge summary appears different.  Previously in church had rapid heart rate went away in a few hours. Had irreg beats all week. Dr. Lovena Le talked about medical therapy and I believe Flecainide use. He was hesitant to use this medication because of side effect profile. After noting more palpitations and frequent PVCs, he did start 04/02/1949 grams twice a day and he states that this is helping him. He does feel some more "gas "this medication.  11/24/15-overall doing very well on his flecainide  100 mg twice a day. We were unable to utilize concomitant metoprolol because of significant bradycardia previously. No syncope, no significant palpitations anymore. He is doing quite well.   Wt Readings from Last 3 Encounters:  11/24/15 187 lb 1.9 oz (84.877 kg)  05/11/15 191 lb (86.637 kg)  04/27/15 194 lb (87.998 kg)     Past Medical History  Diagnosis Date  . Hyperlipidemia   . Hypertension   . Chronic back pain   . Seasonal allergies   . Aortic regurgitation     moderate  . Wolff-Parkinson-White syndrome   . Arthritis     Past Surgical History  Procedure Laterality Date  . Cholecystectomy    . Appendectomy    . Knee surgery    . Cervical spine surgery      Current Outpatient Prescriptions  Medication Sig Dispense Refill  . acetaminophen (TYLENOL) 500 MG tablet Take 1,000 mg by mouth every 6 (six) hours as needed.      Marland Kitchen aspirin 81 MG tablet Take 81 mg by mouth daily.     . flecainide (TAMBOCOR) 100 MG tablet Take 1 tablet (100 mg total) by mouth 2 (two) times daily. 180 tablet 3  . fluticasone (FLONASE) 50 MCG/ACT nasal spray Place 2 sprays into both nostrils daily.    Marland Kitchen losartan (COZAAR) 50 MG tablet Take 50 mg by mouth daily.    . multivitamin (THERAGRAN) per tablet Take 1 tablet by mouth daily.      . NON FORMULARY Max vision  eye vitamin. 1 tablet daily     . simvastatin (ZOCOR) 20 MG tablet Take 1 tablet by mouth At bedtime as needed.     No current facility-administered medications for this visit.    Allergies:    Allergies  Allergen Reactions  . Lisinopril Cough    Social History:  The patient  reports that he quit smoking about 12 years ago. He does not have any smokeless tobacco history on file. He reports that he does not drink alcohol or use illicit drugs.   ROS:  Please see the history of present illness.   Denies bleeding, syncope, orthopnea, PND    PHYSICAL EXAM: VS:  BP 116/82 mmHg  Pulse 73  Ht 5\' 7"  (1.702 m)  Wt 187 lb 1.9 oz (84.877 kg)   BMI 29.30 kg/m2 Well nourished, well developed, in no acute distress HEENT: normal Neck: no JVD Cardiac:  normal S1, S2; RRR; 2/6 SEM RUSB, no ectopy Lungs:  clear to auscultation bilaterally, no wheezing, rhonchi or rales Abd: soft, nontender, no hepatomegaly, obese Ext: no edema Skin: warm and dry Neuro: no focal abnormalities noted  EKG:  Today 11/24/15-sinus rhythm, 67, no other abnormalities, QRS duration is 104 ms. This is on flecainide 100 mg twice a day. (Previously, heart rate as low as 30s, low 40s on low-dose metoprolol)  prior11/2/15 - Sinus rhythm rate 75 with frequent PVCs   (4 on 12 lead) ventricular trigeminy  Holter monitor 7/15-frequent PVCs (lowering affective heart rate)  Exercise treadmill test: 08/24/14-after starting flecainide-doing well. Exaggerated blood pressure response.  ECHO - 05/11/15 - Left ventricle: The cavity size was normal. Wall thickness wasincreased in a pattern of mild LVH. Systolic function was normal.The estimated ejection fraction was in the range of 50% to 55%.Wall motion was normal; there were no regional wall motionabnormalities. Doppler parameters are consistent with abnormalleft ventricular relaxation (grade 1 diastolic dysfunction). - Aortic valve: There was mild regurgitation.  ASSESSMENT AND PLAN:  1. WPW-currently asymptomatic except for palpitations which are PVC related and currently well controlled with flecainide. Prior ablative procedure. Dr. Lovena Le  2. PVCs-flecainide to 100 mg twice a day. Asymptomatic at this point.exercise treadmill test reviewed after beginning this medication and normal without any ischemic changes or ventricular tachycardia. 3. Heart murmur-echocardiogram unremarkable except for mild aortic regurgitation. He states that he has had this since birth. 4. Hypertension-good overall control. Medications reviewed.he did have a exaggerated blood pressure response to exercise. 5. Obesity-encourage weight  loss. 6. Hyperlipidemia-continuing with statin therapy. No changes made. 7. 12 month follow-up.  Signed, Candee Furbish, MD [facility]  11/24/2015 10:21 AM

## 2015-11-24 NOTE — Patient Instructions (Addendum)
Medication Instructions:  Your physician recommends that you continue on your current medications as directed. Please refer to the Current Medication list given to you today.  Labwork: NONE  Testing/Procedures: NONE  Follow-Up: Your physician wants you to follow-up in: 1  year with Dr. Skains. You will receive a reminder letter in the mail two months in advance. If you don't receive a letter, please call our office to schedule the follow-up appointment.   If you need a refill on your cardiac medications before your next appointment, please call your pharmacy.      

## 2016-03-29 ENCOUNTER — Other Ambulatory Visit: Payer: Self-pay | Admitting: Internal Medicine

## 2016-03-29 DIAGNOSIS — G8929 Other chronic pain: Secondary | ICD-10-CM | POA: Diagnosis not present

## 2016-03-29 DIAGNOSIS — I129 Hypertensive chronic kidney disease with stage 1 through stage 4 chronic kidney disease, or unspecified chronic kidney disease: Secondary | ICD-10-CM | POA: Diagnosis not present

## 2016-03-29 DIAGNOSIS — R911 Solitary pulmonary nodule: Secondary | ICD-10-CM | POA: Diagnosis not present

## 2016-03-29 DIAGNOSIS — N183 Chronic kidney disease, stage 3 (moderate): Secondary | ICD-10-CM | POA: Diagnosis not present

## 2016-03-29 DIAGNOSIS — M5442 Lumbago with sciatica, left side: Secondary | ICD-10-CM | POA: Diagnosis not present

## 2016-03-29 DIAGNOSIS — M5441 Lumbago with sciatica, right side: Secondary | ICD-10-CM | POA: Diagnosis not present

## 2016-04-04 ENCOUNTER — Ambulatory Visit
Admission: RE | Admit: 2016-04-04 | Discharge: 2016-04-04 | Disposition: A | Discharge status: Home / Self Care | Payer: PPO | Source: Ambulatory Visit | Attending: Internal Medicine | Admitting: Internal Medicine

## 2016-04-04 DIAGNOSIS — R911 Solitary pulmonary nodule: Secondary | ICD-10-CM

## 2016-04-04 DIAGNOSIS — R918 Other nonspecific abnormal finding of lung field: Secondary | ICD-10-CM | POA: Diagnosis not present

## 2016-04-06 DIAGNOSIS — H353131 Nonexudative age-related macular degeneration, bilateral, early dry stage: Secondary | ICD-10-CM | POA: Diagnosis not present

## 2016-04-06 DIAGNOSIS — H2513 Age-related nuclear cataract, bilateral: Secondary | ICD-10-CM | POA: Diagnosis not present

## 2016-05-09 ENCOUNTER — Other Ambulatory Visit: Payer: Self-pay | Admitting: Cardiology

## 2016-09-20 DIAGNOSIS — L820 Inflamed seborrheic keratosis: Secondary | ICD-10-CM | POA: Diagnosis not present

## 2016-09-20 DIAGNOSIS — D225 Melanocytic nevi of trunk: Secondary | ICD-10-CM | POA: Diagnosis not present

## 2016-09-20 DIAGNOSIS — Z1283 Encounter for screening for malignant neoplasm of skin: Secondary | ICD-10-CM | POA: Diagnosis not present

## 2016-09-20 DIAGNOSIS — L570 Actinic keratosis: Secondary | ICD-10-CM | POA: Diagnosis not present

## 2016-09-20 DIAGNOSIS — X32XXXA Exposure to sunlight, initial encounter: Secondary | ICD-10-CM | POA: Diagnosis not present

## 2016-10-03 ENCOUNTER — Other Ambulatory Visit: Payer: Self-pay | Admitting: Internal Medicine

## 2016-10-03 DIAGNOSIS — Z1389 Encounter for screening for other disorder: Secondary | ICD-10-CM | POA: Diagnosis not present

## 2016-10-03 DIAGNOSIS — N183 Chronic kidney disease, stage 3 (moderate): Secondary | ICD-10-CM | POA: Diagnosis not present

## 2016-10-03 DIAGNOSIS — E7800 Pure hypercholesterolemia, unspecified: Secondary | ICD-10-CM | POA: Diagnosis not present

## 2016-10-03 DIAGNOSIS — I129 Hypertensive chronic kidney disease with stage 1 through stage 4 chronic kidney disease, or unspecified chronic kidney disease: Secondary | ICD-10-CM | POA: Diagnosis not present

## 2016-10-03 DIAGNOSIS — R911 Solitary pulmonary nodule: Secondary | ICD-10-CM

## 2016-10-03 DIAGNOSIS — Z0000 Encounter for general adult medical examination without abnormal findings: Secondary | ICD-10-CM | POA: Diagnosis not present

## 2016-10-03 DIAGNOSIS — Z6830 Body mass index (BMI) 30.0-30.9, adult: Secondary | ICD-10-CM | POA: Diagnosis not present

## 2016-10-31 ENCOUNTER — Other Ambulatory Visit: Payer: PPO

## 2016-11-07 ENCOUNTER — Ambulatory Visit
Admission: RE | Admit: 2016-11-07 | Discharge: 2016-11-07 | Disposition: A | Discharge status: Home / Self Care | Payer: PPO | Source: Ambulatory Visit | Attending: Internal Medicine | Admitting: Internal Medicine

## 2016-11-07 DIAGNOSIS — R918 Other nonspecific abnormal finding of lung field: Secondary | ICD-10-CM | POA: Diagnosis not present

## 2016-11-07 DIAGNOSIS — R911 Solitary pulmonary nodule: Secondary | ICD-10-CM

## 2016-11-13 ENCOUNTER — Other Ambulatory Visit: Payer: Self-pay | Admitting: Cardiology

## 2016-11-14 ENCOUNTER — Other Ambulatory Visit: Payer: Self-pay | Admitting: Internal Medicine

## 2016-11-14 DIAGNOSIS — R911 Solitary pulmonary nodule: Secondary | ICD-10-CM

## 2016-11-14 DIAGNOSIS — H353111 Nonexudative age-related macular degeneration, right eye, early dry stage: Secondary | ICD-10-CM | POA: Diagnosis not present

## 2016-11-14 DIAGNOSIS — H353122 Nonexudative age-related macular degeneration, left eye, intermediate dry stage: Secondary | ICD-10-CM | POA: Diagnosis not present

## 2016-11-14 DIAGNOSIS — H2513 Age-related nuclear cataract, bilateral: Secondary | ICD-10-CM | POA: Diagnosis not present

## 2016-11-15 ENCOUNTER — Encounter: Payer: Self-pay | Admitting: *Deleted

## 2016-11-23 ENCOUNTER — Encounter: Payer: Self-pay | Admitting: Cardiology

## 2016-11-23 ENCOUNTER — Ambulatory Visit (INDEPENDENT_AMBULATORY_CARE_PROVIDER_SITE_OTHER): Payer: PPO | Admitting: Cardiology

## 2016-11-23 ENCOUNTER — Encounter (INDEPENDENT_AMBULATORY_CARE_PROVIDER_SITE_OTHER): Payer: Self-pay

## 2016-11-23 VITALS — BP 148/82 | HR 66 | Ht 67.0 in | Wt 190.4 lb

## 2016-11-23 DIAGNOSIS — I493 Ventricular premature depolarization: Secondary | ICD-10-CM | POA: Diagnosis not present

## 2016-11-23 DIAGNOSIS — I471 Supraventricular tachycardia: Secondary | ICD-10-CM | POA: Diagnosis not present

## 2016-11-23 DIAGNOSIS — I10 Essential (primary) hypertension: Secondary | ICD-10-CM | POA: Diagnosis not present

## 2016-11-23 MED ORDER — LOSARTAN POTASSIUM 100 MG PO TABS: 100.0000 mg | ORAL_TABLET | Freq: Every day | ORAL | 3 refills | Status: DC

## 2016-11-23 MED ORDER — FLECAINIDE ACETATE 100 MG PO TABS: 100.0000 mg | ORAL_TABLET | Freq: Two times a day (BID) | ORAL | 3 refills | Status: DC

## 2016-11-23 NOTE — Patient Instructions (Signed)
Medication Instructions:  Please increase your Losartan to 100 mg daily. Continue all other medications as listed.  Follow-Up: Follow up in 1 year with Dr. Marlou Porch.  You will receive a letter in the mail 2 months before you are due.  Please call us when you receive this letter to schedule your follow up appointment.  If you need a refill on your cardiac medications before your next appointment, please call your pharmacy.  Thank you for choosing [facility]!!

## 2016-11-23 NOTE — Progress Notes (Signed)
[facility]. [address], [address], [place]  [address] Phone: [phone] Fax:  [phone]  Date:  11/23/2016   ID:  [name], DOB [birth year], MRN [id number]  PCP:  Irven Shelling, MD   History of Present Illness: [name] is a 70 y.o. male  with WPW status post ablation by Dr. Lovena Le on 04/20/11 now taking low-dose flecainide 50 mg twice a day for frequent PVCs detected on Holter monitor in July 2015. Ejection fraction normal. Hyperlipidemia on simvastatin. Hypertension on Losartan.  Recently he has been feeling more palpitations, PVCs. At first, the flecainide was working quite well. No chest pain, no syncope. Has long-standing heart murmur.   He is about to retire from [employer] since 1970.   Previously: He was in the emergency room once on May 17, 2011 for symptoms of lightheadedness dizziness his head felt like it was pounding. He felt like his heart was fluttering. He had pain in his left jaw and under his left shoulder blade. His heart rate was effectively in the 40s. He was told to taper off his metoprolol. He was on metoprolol tartrate 12.5 mg twice a day at the time. Hemoglobin was normal head CT was done which was unremarkable. EKG showed PVCs. Otherwise unremarkable. Dr. Nira Retort saw in the emergency department.  Both he and his wife previously reminded me that the ablation did not result in complete termination of accessory pathway. The dictated discharge summary appears different.  Previously in church had rapid heart rate went away in a few hours. Had irreg beats all week. Dr. Lovena Le talked about medical therapy and I believe Flecainide use. He was hesitant to use this medication because of side effect profile. After noting more palpitations and frequent PVCs, he did start flecainide twice a day and he states that this is helping him. He does feel some more "gas "this medication.  11/24/15-overall doing very well on his flecainide 100 mg  twice a day. We were unable to utilize concomitant metoprolol because of significant bradycardia previously. No syncope, no significant palpitations anymore. He is doing quite well.  11/23/16-continues to do well. Occasional palpitation at night when laying down. No sustained arrhythmias. No syncope, no bleeding, no orthopnea, no PND. No chest pain. Has some mild shortness of breath with physical activity such as climbing [employer]. He is going to [place].   Wt Readings from Last 3 Encounters:  11/23/16 190 lb 6.4 oz (86.4 kg)  11/24/15 187 lb 1.9 oz (84.9 kg)  05/11/15 191 lb (86.6 kg)     Past Medical History:  Diagnosis Date  . Aortic regurgitation    moderate  . Arthritis   . Chronic back pain   . Hyperlipidemia   . Hypertension   . Seasonal allergies   . Wolff-Parkinson-White syndrome     Past Surgical History:  Procedure Laterality Date  . APPENDECTOMY    . CERVICAL SPINE SURGERY    . CHOLECYSTECTOMY    . KNEE SURGERY      Current Outpatient Prescriptions  Medication Sig Dispense Refill  . acetaminophen (TYLENOL) 500 MG tablet Take 1,000 mg by mouth every 6 (six) hours as needed.      Marland Kitchen aspirin 81 MG tablet Take 81 mg by mouth daily.     . flecainide (TAMBOCOR) 100 MG tablet Take 1 tablet (100 mg total) by mouth 2 (two) times daily. 180 tablet 3  . fluticasone (FLONASE) 50 MCG/ACT nasal spray Place  2 sprays into both nostrils daily.    Marland Kitchen losartan (COZAAR) 100 MG tablet Take 1 tablet (100 mg total) by mouth daily. 90 tablet 3  . multivitamin (THERAGRAN) per tablet Take 1 tablet by mouth daily.      . NON FORMULARY Max vision eye vitamin. 1 tablet daily     . simvastatin (ZOCOR) 20 MG tablet Take 1 tablet by mouth At bedtime as needed.     No current facility-administered medications for this visit.     Allergies:    Allergies  Allergen Reactions  . Lisinopril Cough    Social History:  The patient  reports that he quit smoking about 13 years ago. He has  never used smokeless tobacco. He reports that he does not drink alcohol or use drugs.   ROS:  Please see the history of present illness.   Denies bleeding, syncope, orthopnea, PND    PHYSICAL EXAM: VS:  BP (!) 148/82   Pulse 66   Ht 5\' 7"  (1.702 m)   Wt 190 lb 6.4 oz (86.4 kg)   BMI 29.82 kg/m  Well nourished, well developed, in no acute distress  HEENT: normal  Neck: no JVD  Cardiac:  normal S1, S2; RRR; 2/6 SEM RUSB, no ectopy  Lungs:  clear to auscultation bilaterally, no wheezing, rhonchi or rales  Abd: soft, nontender, no hepatomegaly, obese  Ext: no edema  Skin: warm and dry  Neuro: no focal abnormalities noted  EKG:  Today 11/23/16: NSR 66, borderline IVCD. 11/24/15-sinus rhythm, 67, no other abnormalities, QRS duration is 104 ms. This is on flecainide 100 mg twice a day. (Previously, heart rate as low as 30s, low 40s on low-dose metoprolol)  prior11/2/15 - Sinus rhythm rate 75 with frequent PVCs   (4 on 12 lead) ventricular trigeminy  Holter monitor 7/15-frequent PVCs (lowering affective heart rate)  Exercise treadmill test: 08/24/14-after starting flecainide-doing well. Exaggerated blood pressure response.  ECHO - 05/11/15 - Left ventricle: The cavity size was normal. Wall thickness wasincreased in a pattern of mild LVH. Systolic function was normal.The estimated ejection fraction was in the range of 50% to 55%.Wall motion was normal; there were no regional wall motionabnormalities. Doppler parameters are consistent with abnormalleft ventricular relaxation (grade 1 diastolic dysfunction). - Aortic valve: There was mild regurgitation.  ASSESSMENT AND PLAN:  1. WPW-currently asymptomatic except for palpitations which are PVC related and currently well controlled with flecainide. Prior ablative procedure. Dr. Lovena Le  2. PVCs-flecainide to 100 mg twice a day. Asymptomatic.Exercise treadmill test reviewed after beginning this medication and normal without any ischemic  changes or ventricular tachycardia. He feels occasional skipped beats at night. 3. Heart murmur-echocardiogram unremarkable except for mild aortic regurgitation. He states that he has had this since birth. 4. Hypertension-we will increase his losartan from 50-100 mg (11/23/16). Blood pressure at home usually in the 130s to 140s. 5. Obesity-encourage weight loss. 6. Hyperlipidemia-continuing with statin therapy. No changes made. 7. 12 month follow-up.  Signed, Candee Furbish, MD [facility]  11/23/2016 8:40 AM

## 2017-04-30 ENCOUNTER — Other Ambulatory Visit: Payer: PPO

## 2017-05-01 ENCOUNTER — Ambulatory Visit
Admission: RE | Admit: 2017-05-01 | Discharge: 2017-05-01 | Disposition: A | Discharge status: Home / Self Care | Payer: PPO | Source: Ambulatory Visit | Attending: Internal Medicine | Admitting: Internal Medicine

## 2017-05-01 DIAGNOSIS — R911 Solitary pulmonary nodule: Secondary | ICD-10-CM

## 2017-05-22 DIAGNOSIS — H524 Presbyopia: Secondary | ICD-10-CM | POA: Diagnosis not present

## 2017-05-22 DIAGNOSIS — H353131 Nonexudative age-related macular degeneration, bilateral, early dry stage: Secondary | ICD-10-CM | POA: Diagnosis not present

## 2017-05-22 DIAGNOSIS — H5203 Hypermetropia, bilateral: Secondary | ICD-10-CM | POA: Diagnosis not present

## 2017-05-22 DIAGNOSIS — H2513 Age-related nuclear cataract, bilateral: Secondary | ICD-10-CM | POA: Diagnosis not present

## 2017-09-10 DIAGNOSIS — H6123 Impacted cerumen, bilateral: Secondary | ICD-10-CM | POA: Diagnosis not present

## 2017-10-04 DIAGNOSIS — Z0000 Encounter for general adult medical examination without abnormal findings: Secondary | ICD-10-CM | POA: Diagnosis not present

## 2017-10-04 DIAGNOSIS — E7800 Pure hypercholesterolemia, unspecified: Secondary | ICD-10-CM | POA: Diagnosis not present

## 2017-10-04 DIAGNOSIS — Z1389 Encounter for screening for other disorder: Secondary | ICD-10-CM | POA: Diagnosis not present

## 2017-10-04 DIAGNOSIS — N183 Chronic kidney disease, stage 3 (moderate): Secondary | ICD-10-CM | POA: Diagnosis not present

## 2017-10-04 DIAGNOSIS — I129 Hypertensive chronic kidney disease with stage 1 through stage 4 chronic kidney disease, or unspecified chronic kidney disease: Secondary | ICD-10-CM | POA: Diagnosis not present

## 2017-10-04 DIAGNOSIS — G4733 Obstructive sleep apnea (adult) (pediatric): Secondary | ICD-10-CM | POA: Diagnosis not present

## 2017-10-04 DIAGNOSIS — J309 Allergic rhinitis, unspecified: Secondary | ICD-10-CM | POA: Diagnosis not present

## 2017-10-11 ENCOUNTER — Other Ambulatory Visit: Payer: Self-pay | Admitting: Internal Medicine

## 2017-10-11 DIAGNOSIS — R918 Other nonspecific abnormal finding of lung field: Secondary | ICD-10-CM

## 2017-11-08 ENCOUNTER — Other Ambulatory Visit: Payer: Self-pay | Admitting: Cardiology

## 2017-11-08 MED ORDER — LOSARTAN POTASSIUM 100 MG PO TABS: 100.0000 mg | ORAL_TABLET | Freq: Every day | ORAL | 0 refills | Status: DC

## 2017-11-14 ENCOUNTER — Telehealth: Payer: Self-pay | Admitting: Cardiology

## 2017-11-14 NOTE — Telephone Encounter (Signed)
New message      Pt c/o medication issue:  1. Name of Medication: flecanide   2. How are you currently taking this medication (dosage and times per day)?  2x a day  3. Are you having a reaction (difficulty breathing--STAT)? no  4. What is your medication issue? Insurance company needs to know why he is taking medication , is it due to AFIB , or irregular heartbeat, please call

## 2017-11-14 NOTE — Telephone Encounter (Signed)
I spoke with pt and told him he was on flecainide for PVC's.

## 2017-11-21 DIAGNOSIS — H353131 Nonexudative age-related macular degeneration, bilateral, early dry stage: Secondary | ICD-10-CM | POA: Diagnosis not present

## 2017-11-21 DIAGNOSIS — H2513 Age-related nuclear cataract, bilateral: Secondary | ICD-10-CM | POA: Diagnosis not present

## 2017-11-23 ENCOUNTER — Encounter (INDEPENDENT_AMBULATORY_CARE_PROVIDER_SITE_OTHER): Payer: Self-pay

## 2017-11-23 ENCOUNTER — Ambulatory Visit: Payer: PPO | Admitting: Cardiology

## 2017-11-23 ENCOUNTER — Encounter: Payer: Self-pay | Admitting: Cardiology

## 2017-11-23 VITALS — BP 116/76 | HR 60 | Ht 67.0 in | Wt 188.5 lb

## 2017-11-23 DIAGNOSIS — I10 Essential (primary) hypertension: Secondary | ICD-10-CM | POA: Diagnosis not present

## 2017-11-23 DIAGNOSIS — I471 Supraventricular tachycardia: Secondary | ICD-10-CM | POA: Diagnosis not present

## 2017-11-23 DIAGNOSIS — I493 Ventricular premature depolarization: Secondary | ICD-10-CM

## 2017-11-23 MED ORDER — FLECAINIDE ACETATE 100 MG PO TABS: 100.0000 mg | ORAL_TABLET | Freq: Two times a day (BID) | ORAL | 11 refills | Status: DC

## 2017-11-23 MED ORDER — LOSARTAN POTASSIUM 100 MG PO TABS: 100.0000 mg | ORAL_TABLET | Freq: Every day | ORAL | 11 refills | Status: DC

## 2017-11-23 NOTE — Progress Notes (Signed)
[name]. [address], [place]  [address] Phone: [phone] Fax:  [phone]  Date:  11/23/2017   ID:  [name], DOB 14-Sep-1946, MRN [id number]  PCP:  Phillip Orn, MD   History of Present Illness: [name] is a 71 y.o. male  with WPW status post ablation by Dr. Lovena Le on 04/20/11 now taking low-dose flecainide 100 mg twice a day for frequent PVCs detected on Holter monitor in July 2015. Ejection fraction normal. Hyperlipidemia on simvastatin. Hypertension on Losartan.  Recently he has been feeling more palpitations, PVCs. At first, the flecainide was working quite well. No chest pain, no syncope. Has long-standing heart murmur.   He is about to retire from [name] since 1970.   Previously: He was in the emergency room once on May 17, 2011 for symptoms of lightheadedness dizziness his head felt like it was pounding. He felt like his heart was fluttering. He had pain in his left jaw and under his left shoulder blade. His heart rate was effectively in the 40s. He was told to taper off his metoprolol. He was on metoprolol tartrate 12.5 mg twice a day at the time. Hemoglobin was normal head CT was done which was unremarkable. EKG showed PVCs. Otherwise unremarkable. Dr. Nira Wright saw in the emergency department.  Both he and his wife previously reminded me that the ablation did not result in complete termination of accessory pathway. The dictated discharge summary appears different.  Previously in church had rapid heart rate went away in a few hours. Had irreg beats all week. Dr. Lovena Le talked about medical therapy and I believe Flecainide use. He was hesitant to use this medication because of side effect profile. After noting more palpitations and frequent PVCs, he did start flecainide twice a day and he states that this is helping him. He does feel some more "gas "this medication.  11/24/15-overall doing very well on his flecainide 100 mg twice  a day. We were unable to utilize concomitant metoprolol because of significant bradycardia previously. No syncope, no significant palpitations anymore. He is doing quite well.  11/23/16-continues to do well. Occasional palpitation at night when laying down. No sustained arrhythmias. No syncope, no bleeding, no orthopnea, no PND. No chest pain. Has some mild shortness of breath with physical activity such as climbing [employer]. He is going to [place].  10/3017 - 3 months ago skips. 30 min.  Overall has been doing quite well.  Feels stable.  No chest pain syncope bleeding orthopnea.  He does have some easy bruising noted.   Wt Readings from Last 3 Encounters:  11/23/17 188 lb 8 oz (85.5 kg)  11/23/16 190 lb 6.4 oz (86.4 kg)  11/24/15 187 lb 1.9 oz (84.9 kg)     Past Medical History:  Diagnosis Date  . Aortic regurgitation    moderate  . Arthritis   . Chronic back pain   . Hyperlipidemia   . Hypertension   . Seasonal allergies   . Wolff-Parkinson-White syndrome     Past Surgical History:  Procedure Laterality Date  . APPENDECTOMY    . CERVICAL SPINE SURGERY    . CHOLECYSTECTOMY    . KNEE SURGERY      Current Outpatient Medications  Medication Sig Dispense Refill  . acetaminophen (TYLENOL) 500 MG tablet Take 1,000 mg by mouth every 6 (six) hours as needed.      Marland Kitchen aspirin 81 MG tablet Take 81 mg by  mouth daily.     . flecainide (TAMBOCOR) 100 MG tablet Take 1 tablet (100 mg total) by mouth 2 (two) times daily. 60 tablet 11  . fluticasone (FLONASE) 50 MCG/ACT nasal spray Place 2 sprays into both nostrils daily.    Marland Kitchen losartan (COZAAR) 100 MG tablet Take 1 tablet (100 mg total) by mouth daily. 30 tablet 11  . multivitamin (THERAGRAN) per tablet Take 1 tablet by mouth daily.      . NON FORMULARY Max vision eye vitamin. 1 tablet daily     . simvastatin (ZOCOR) 20 MG tablet Take 1 tablet by mouth At bedtime as needed.     No current facility-administered medications for this  visit.     Allergies:    Allergies  Allergen Reactions  . Lisinopril Cough    Social History:  The patient  reports that he quit smoking about 14 years ago. he has never used smokeless tobacco. He reports that he does not drink alcohol or use drugs.   ROS:  Please see the history of present illness.   Denies bleeding, syncope, orthopnea, PND    PHYSICAL EXAM: VS:  BP 116/76   Pulse 60   Ht 5\' 7"  (1.702 m)   Wt 188 lb 8 oz (85.5 kg)   BMI 29.52 kg/m  Well nourished, well developed, in no acute distress  HEENT: normal  Neck: no JVD  Cardiac:  normal S1, S2; RRR; 2/6 SEM RUSB, no ectopy  Lungs:  clear to auscultation bilaterally, no wheezing, rhonchi or rales  Abd: soft, nontender, no hepatomegaly, obese  Ext: no edema  Skin: warm and dry, bruising noted left hand Neuro: no focal abnormalities noted  EKG:  Today 11/23/17-sinus rhythm 60, QRS duration 110 ms, doing very well personally viewed-prior 11/23/16: NSR 66, borderline IVCD. 11/24/15-sinus rhythm, 67, no other abnormalities, QRS duration is 104 ms. This is on flecainide 100 mg twice a day. (Previously, heart rate as low as 30s, low 40s on low-dose metoprolol)  prior11/2/15 - Sinus rhythm rate 75 with frequent PVCs   (4 on 12 lead) ventricular trigeminy  Holter monitor 7/15-frequent PVCs (lowering affective heart rate)  Exercise treadmill test: 08/24/14-after starting flecainide-doing well. Exaggerated blood pressure response.  ECHO - 05/11/15 - Left ventricle: The cavity size was normal. Wall thickness wasincreased in a pattern of mild LVH. Systolic function was normal.The estimated ejection fraction was in the range of 50% to 55%.Wall motion was normal; there were no regional wall motionabnormalities. Doppler parameters are consistent with abnormalleft ventricular relaxation (grade 1 diastolic dysfunction). - Aortic valve: There was mild regurgitation.  ASSESSMENT AND PLAN:  1. WPW-currently asymptomatic except  for palpitations which are PVC related and currently well controlled with flecainide. Prior ablative procedure. Dr. Lovena Le overall has been doing very well.  He had one brief episode of palpitations lasting 30 minutes.  No changes. 2. PVCs-flecainide to 100 mg twice a day. Asymptomatic.Exercise treadmill test reviewed after beginning this medication and normal without any ischemic changes or ventricular tachycardia. He feels occasional skipped beats at night.  Most recently had one 30-minute episode of palpitations, likely PVCs.  He has been doing well. 3. Heart murmur-echocardiogram unremarkable except for mild aortic regurgitation. He states that he has had this since birth.  Appreciated the heart murmur again today.  Mild aortic regurgitation on most recent echocardiogram. 4. Hypertension-we will increase his losartan from 50-100 mg (11/23/16). Blood pressure at home usually in the 130s to 140s.  Watching blood pressure, no changes  made. 5. Obesity-encourage weight loss.  Watching blood pressure. 6. Hyperlipidemia-continuing with statin therapy. No changes made. 7. 12 month follow-up.  Signed, Candee Furbish, MD [facility]  11/23/2017 8:49 AM

## 2017-11-23 NOTE — Patient Instructions (Signed)

## 2018-02-28 ENCOUNTER — Telehealth: Payer: Self-pay | Admitting: *Deleted

## 2018-02-28 NOTE — Telephone Encounter (Signed)
[employer] request additional information for approval of FLECAINIDE, forms completed, including prescriber notes, and faxed back to [employer].

## 2018-03-07 NOTE — Telephone Encounter (Signed)
Received note from [place] with approval for FLECAINIDE dates 12/24/2017-12/24/2018. [place] notified.

## 2018-05-14 ENCOUNTER — Ambulatory Visit
Admission: RE | Admit: 2018-05-14 | Discharge: 2018-05-14 | Disposition: A | Discharge status: Home / Self Care | Payer: PPO | Source: Ambulatory Visit | Attending: Internal Medicine | Admitting: Internal Medicine

## 2018-05-14 DIAGNOSIS — R918 Other nonspecific abnormal finding of lung field: Secondary | ICD-10-CM

## 2018-05-14 DIAGNOSIS — R911 Solitary pulmonary nodule: Secondary | ICD-10-CM | POA: Diagnosis not present

## 2018-05-23 DIAGNOSIS — H524 Presbyopia: Secondary | ICD-10-CM | POA: Diagnosis not present

## 2018-07-29 DIAGNOSIS — Z0100 Encounter for examination of eyes and vision without abnormal findings: Secondary | ICD-10-CM | POA: Diagnosis not present

## 2018-08-22 DIAGNOSIS — R69 Illness, unspecified: Secondary | ICD-10-CM | POA: Diagnosis not present

## 2018-09-01 DIAGNOSIS — R69 Illness, unspecified: Secondary | ICD-10-CM | POA: Diagnosis not present

## 2018-10-08 DIAGNOSIS — Z125 Encounter for screening for malignant neoplasm of prostate: Secondary | ICD-10-CM | POA: Diagnosis not present

## 2018-10-08 DIAGNOSIS — Z1389 Encounter for screening for other disorder: Secondary | ICD-10-CM | POA: Diagnosis not present

## 2018-10-08 DIAGNOSIS — N183 Chronic kidney disease, stage 3 (moderate): Secondary | ICD-10-CM | POA: Diagnosis not present

## 2018-10-08 DIAGNOSIS — Z0000 Encounter for general adult medical examination without abnormal findings: Secondary | ICD-10-CM | POA: Diagnosis not present

## 2018-10-08 DIAGNOSIS — I129 Hypertensive chronic kidney disease with stage 1 through stage 4 chronic kidney disease, or unspecified chronic kidney disease: Secondary | ICD-10-CM | POA: Diagnosis not present

## 2018-10-08 DIAGNOSIS — R911 Solitary pulmonary nodule: Secondary | ICD-10-CM | POA: Diagnosis not present

## 2018-10-08 DIAGNOSIS — E7800 Pure hypercholesterolemia, unspecified: Secondary | ICD-10-CM | POA: Diagnosis not present

## 2018-10-08 DIAGNOSIS — M25511 Pain in right shoulder: Secondary | ICD-10-CM | POA: Diagnosis not present

## 2018-10-15 DIAGNOSIS — M25512 Pain in left shoulder: Secondary | ICD-10-CM | POA: Diagnosis not present

## 2018-10-15 DIAGNOSIS — M67911 Unspecified disorder of synovium and tendon, right shoulder: Secondary | ICD-10-CM | POA: Diagnosis not present

## 2018-10-29 DIAGNOSIS — M542 Cervicalgia: Secondary | ICD-10-CM | POA: Diagnosis not present

## 2018-10-29 DIAGNOSIS — M67911 Unspecified disorder of synovium and tendon, right shoulder: Secondary | ICD-10-CM | POA: Diagnosis not present

## 2018-10-31 ENCOUNTER — Telehealth: Payer: Self-pay | Admitting: *Deleted

## 2018-10-31 NOTE — Telephone Encounter (Signed)
Received fax from [place], patient has been approved for FLECANIDE  Dates 12/24/2018-12/25/2019

## 2018-11-13 ENCOUNTER — Ambulatory Visit: Payer: Medicare HMO | Admitting: Cardiology

## 2018-11-13 ENCOUNTER — Encounter: Payer: Self-pay | Admitting: Cardiology

## 2018-11-13 VITALS — BP 134/74 | HR 79 | Ht 67.0 in | Wt 185.8 lb

## 2018-11-13 DIAGNOSIS — I2584 Coronary atherosclerosis due to calcified coronary lesion: Secondary | ICD-10-CM

## 2018-11-13 DIAGNOSIS — I2510 Atherosclerotic heart disease of native coronary artery without angina pectoris: Secondary | ICD-10-CM

## 2018-11-13 DIAGNOSIS — I10 Essential (primary) hypertension: Secondary | ICD-10-CM | POA: Diagnosis not present

## 2018-11-13 DIAGNOSIS — I456 Pre-excitation syndrome: Secondary | ICD-10-CM | POA: Diagnosis not present

## 2018-11-13 DIAGNOSIS — I471 Supraventricular tachycardia: Secondary | ICD-10-CM | POA: Diagnosis not present

## 2018-11-13 DIAGNOSIS — I700 Atherosclerosis of aorta: Secondary | ICD-10-CM | POA: Diagnosis not present

## 2018-11-13 MED ORDER — ATORVASTATIN CALCIUM 20 MG PO TABS: 20.0000 mg | ORAL_TABLET | Freq: Every day | ORAL | 3 refills | Status: DC

## 2018-11-13 MED ORDER — FLECAINIDE ACETATE 100 MG PO TABS: 100.0000 mg | ORAL_TABLET | Freq: Two times a day (BID) | ORAL | 3 refills | Status: DC

## 2018-11-13 MED ORDER — FLECAINIDE ACETATE 100 MG PO TABS: 100.0000 mg | ORAL_TABLET | Freq: Two times a day (BID) | ORAL | 11 refills | Status: DC

## 2018-11-13 NOTE — Progress Notes (Signed)
Cardiology Office Note:    Date:  11/13/2018   ID:  [name], DOB [birth year], MRN [id number]  PCP:  Phillip Orn, MD  Cardiologist:  No primary care provider on file.  Electrophysiologist:  None   Referring MD: Phillip Orn, MD     History of Present Illness:    [name] is a 72 y.o. male with WPW post ablation [name] 2012 on flecanide 100 mg twice a day.  EF normal.  Here for follow-up of arrhythmia.  Overall he is been doing quite well.  He enjoyed his travels to [place] in [place] to visit his son.  He has not had any chest pain fevers chills nausea vomiting syncope palpitations.  Has been feeling good.  Did review CT scan which showed aortic atherosclerosis and coronary artery disease consultation.  Changed his statin as below.    Past Medical History:  Diagnosis Date  . Aortic regurgitation    moderate  . Arthritis   . Chronic back pain   . Hyperlipidemia   . Hypertension   . Seasonal allergies   . Wolff-Parkinson-White syndrome     Past Surgical History:  Procedure Laterality Date  . APPENDECTOMY    . CERVICAL SPINE SURGERY    . CHOLECYSTECTOMY    . KNEE SURGERY      Current Medications: Current Meds  Medication Sig  . acetaminophen (TYLENOL) 500 MG tablet Take 1,000 mg by mouth every 6 (six) hours as needed.    Marland Kitchen aspirin 81 MG tablet Take 81 mg by mouth daily.   . flecainide (TAMBOCOR) 100 MG tablet Take 1 tablet (100 mg total) by mouth 2 (two) times daily.  . fluticasone (FLONASE) 50 MCG/ACT nasal spray Place 2 sprays into both nostrils daily.  Marland Kitchen losartan (COZAAR) 100 MG tablet Take 1 tablet (100 mg total) by mouth daily.  . multivitamin (THERAGRAN) per tablet Take 1 tablet by mouth daily.    . NON FORMULARY Max vision eye vitamin. 1 tablet daily   . [DISCONTINUED] flecainide (TAMBOCOR) 100 MG tablet Take 1 tablet (100 mg total) by mouth 2 (two) times daily.  . [DISCONTINUED] flecainide (TAMBOCOR) 100 MG tablet Take 1 tablet (100  mg total) by mouth 2 (two) times daily.  . [DISCONTINUED] simvastatin (ZOCOR) 20 MG tablet Take 1 tablet by mouth At bedtime as needed.     Allergies:   Lisinopril   Social History   Socioeconomic History  . Marital status: Married    Spouse name: Not on file  . Number of children: 2  . Years of education: Not on file  . Highest education level: Not on file  Occupational History  . Not on file  Social Needs  . Financial resource strain: Not on file  . Food insecurity:    Worry: Not on file    Inability: Not on file  . Transportation needs:    Medical: Not on file    Non-medical: Not on file  Tobacco Use  . Smoking status: Former Smoker    Last attempt to quit: 12/25/2002    Years since quitting: 15.8  . Smokeless tobacco: Never Used  Substance and Sexual Activity  . Alcohol use: No  . Drug use: No  . Sexual activity: Not on file  Lifestyle  . Physical activity:    Days per week: Not on file    Minutes per session: Not on file  . Stress: Not on file  Relationships  . Social connections:  Talks on phone: Not on file    Gets together: Not on file    Attends religious service: Not on file    Active member of club or organization: Not on file    Attends meetings of clubs or organizations: Not on file    Relationship status: Not on file  Other Topics Concern  . Not on file  Social History Narrative  . Not on file     Family History: The patient's family history includes Other in his mother; Stroke (age of onset: 62) in his father.  ROS:   Please see the history of present illness.     All other systems reviewed and are negative.  EKGs/Labs/Other Studies Reviewed:    The following studies were reviewed today: ECHO - Left ventricle: The cavity size was normal. Wall thickness was   increased in a pattern of mild LVH. Systolic function was normal.   The estimated ejection fraction was in the range of 50% to 55%.   Wall motion was normal; there were no regional  wall motion   abnormalities. Doppler parameters are consistent with abnormal   left ventricular relaxation (grade 1 diastolic dysfunction). - Aortic valve: There was mild regurgitation.  EKG:  EKG is  ordered today.  The ekg ordered today demonstrates QRS duration 118, normal sinus rhythm with nonspecific interventricular conduction delay.  Prior QRS duration was a proximally 110 ms.  Personally reviewed and interpreted.  Recent Labs: No results found for requested labs within last 8760 hours.  Recent Lipid Panel No results found for: CHOL, TRIG, HDL, CHOLHDL, VLDL, LDLCALC, LDLDIRECT  Physical Exam:    VS:  BP 134/74   Pulse 79   Ht 5\' 7"  (1.702 m)   Wt 185 lb 12.8 oz (84.3 kg)   SpO2 96%   BMI 29.10 kg/m     Wt Readings from Last 3 Encounters:  11/13/18 185 lb 12.8 oz (84.3 kg)  11/23/17 188 lb 8 oz (85.5 kg)  11/23/16 190 lb 6.4 oz (86.4 kg)     GEN:  Well nourished, well developed in no acute distress HEENT: Normal NECK: No JVD; No carotid bruits LYMPHATICS: No lymphadenopathy CARDIAC: RRR, no murmurs, rubs, gallops RESPIRATORY:  Clear to auscultation without rales, wheezing or rhonchi  ABDOMEN: Soft, non-tender, non-distended MUSCULOSKELETAL:  No edema; No deformity  SKIN: Warm and dry NEUROLOGIC:  Alert and oriented x 3 PSYCHIATRIC:  Normal affect   ASSESSMENT:    1. WPW (Wolff-Parkinson-White syndrome)   2. Essential hypertension   3. Paroxysmal SVT (supraventricular tachycardia) ([place])   4. Aortic atherosclerosis ([place])   5. Coronary artery calcification    PLAN:    In order of problems listed above:  WPW - Flecanide 100 mg twice a day.  EKG unremarkable.  QRS duration 118 ms.  [name]. Post ablation  PVCs -Flecanide.  Exercise treadmill test showed no ischemic changes, no VT, no significant widening of QRS.  Mild aortic regurgitation - Heart murmur.  Continue to monitor clinically.  Essential hypertension - Currently well controlled.   Medications reviewed.  Hyperlipidemia - On simvastatin 20 mg.  I will change him to atorvastatin 20 mill grams once a day.  I would like for him to continue with this because of his aortic atherosclerosis and coronary calcification noted on CT scan personally reviewed on 05/14/2018.  Last LDL was 50.  Creatinine 1.1.  Excellent.  Aortic atherosclerosis - Continue with statin.  Secondary prevention.   Medication Adjustments/Labs and Tests Ordered:  Current medicines are reviewed at length with the patient today.  Concerns regarding medicines are outlined above.  Orders Placed This Encounter  Procedures  . EKG 12-Lead   Meds ordered this encounter  Medications  . DISCONTD: flecainide (TAMBOCOR) 100 MG tablet    Sig: Take 1 tablet (100 mg total) by mouth 2 (two) times daily.    Dispense:  60 tablet    Refill:  11    Please hold RX until pt calls for it to be filled  . DISCONTD: atorvastatin (LIPITOR) 20 MG tablet    Sig: Take 1 tablet (20 mg total) by mouth daily.    Dispense:  90 tablet    Refill:  3  . flecainide (TAMBOCOR) 100 MG tablet    Sig: Take 1 tablet (100 mg total) by mouth 2 (two) times daily.    Dispense:  180 tablet    Refill:  3    Please hold RX until pt calls for it to be filled  . atorvastatin (LIPITOR) 20 MG tablet    Sig: Take 1 tablet (20 mg total) by mouth daily.    Dispense:  90 tablet    Refill:  3    Patient Instructions  Your physician has recommended you make the following change in your medication: STOP SIMVASTATIN START ATORVASTATIN 20 MG EVERY EVENING    Your physician wants you to follow-up in: [place] will receive a reminder letter in the mail two months in advance. If you don't receive a letter, please call our office to schedule the follow-up appointment.      Signed, Phillip Furbish, MD  11/13/2018 10:23 AM    [facility]

## 2018-11-13 NOTE — Patient Instructions (Addendum)
Your physician has recommended you make the following change in your medication: STOP SIMVASTATIN START ATORVASTATIN 20 MG EVERY EVENING    Your physician wants you to follow-up in: [place] will receive a reminder letter in the mail two months in advance. If you don't receive a letter, please call our office to schedule the follow-up appointment.

## 2018-11-29 ENCOUNTER — Other Ambulatory Visit: Payer: Self-pay | Admitting: Cardiology

## 2019-01-14 DIAGNOSIS — M25511 Pain in right shoulder: Secondary | ICD-10-CM | POA: Diagnosis not present

## 2019-01-14 DIAGNOSIS — M67911 Unspecified disorder of synovium and tendon, right shoulder: Secondary | ICD-10-CM | POA: Diagnosis not present

## 2019-01-21 DIAGNOSIS — M25511 Pain in right shoulder: Secondary | ICD-10-CM | POA: Diagnosis not present

## 2019-01-23 DIAGNOSIS — M67911 Unspecified disorder of synovium and tendon, right shoulder: Secondary | ICD-10-CM | POA: Diagnosis not present

## 2019-01-25 DIAGNOSIS — M75121 Complete rotator cuff tear or rupture of right shoulder, not specified as traumatic: Secondary | ICD-10-CM | POA: Diagnosis not present

## 2019-01-30 DIAGNOSIS — H353122 Nonexudative age-related macular degeneration, left eye, intermediate dry stage: Secondary | ICD-10-CM | POA: Diagnosis not present

## 2019-01-30 DIAGNOSIS — H353111 Nonexudative age-related macular degeneration, right eye, early dry stage: Secondary | ICD-10-CM | POA: Diagnosis not present

## 2019-02-03 ENCOUNTER — Telehealth: Payer: Self-pay | Admitting: *Deleted

## 2019-02-03 NOTE — Telephone Encounter (Signed)
   [facility] Pre-operative Risk Assessment    Request for surgical clearance:  1. What type of surgery is being performed? RIGHT SHOULDER ARTHROSCOPIC ROTATOR CUFF REPAIR   2. When is this surgery scheduled? 03/11/19   3. What type of clearance is required (medical clearance vs. Pharmacy clearance to hold med vs. Both)? MEDICAL  4. Are there any medications that need to be held prior to surgery and how long?ASA   5. Practice name and name of physician performing surgery? [facility]; DR. Larkin Ina CHANDLER  6. What is your office phone number [phone]    7.   What is your office fax number [phone]  8.   Anesthesia type (None, local, MAC, general) ? CHOICE   Julaine Hua 02/03/2019, 2:10 PM  _________________________________________________________________   (provider comments below)

## 2019-02-05 NOTE — Telephone Encounter (Signed)
   Primary Cardiologist: Candee Furbish, MD  Chart reviewed as part of pre-operative protocol coverage. Patient was contacted 02/05/2019 in reference to pre-operative risk assessment for pending surgery as outlined below.  [name] was last seen on 11/13/2018 by Dr. Marlou Porch.  Since that day, [name] has done well without any chest pain or shortness of breath. 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   Therefore, based on ACC/AHA guidelines, the patient would be at acceptable risk for the planned procedure without further cardiovascular testing.   I will route this recommendation to the requesting party via Epic fax function and remove from pre-op pool.  Please call with questions. He may hold aspirin for 5-7 days prior to the surgery.   [place], [place] 02/05/2019, 12:57 PM

## 2019-03-11 DIAGNOSIS — M75121 Complete rotator cuff tear or rupture of right shoulder, not specified as traumatic: Secondary | ICD-10-CM | POA: Diagnosis not present

## 2019-03-11 DIAGNOSIS — M24111 Other articular cartilage disorders, right shoulder: Secondary | ICD-10-CM | POA: Diagnosis not present

## 2019-03-11 DIAGNOSIS — G8918 Other acute postprocedural pain: Secondary | ICD-10-CM | POA: Diagnosis not present

## 2019-03-11 DIAGNOSIS — M7541 Impingement syndrome of right shoulder: Secondary | ICD-10-CM | POA: Diagnosis not present

## 2019-03-19 DIAGNOSIS — M25611 Stiffness of right shoulder, not elsewhere classified: Secondary | ICD-10-CM | POA: Diagnosis not present

## 2019-03-19 DIAGNOSIS — Z9889 Other specified postprocedural states: Secondary | ICD-10-CM | POA: Diagnosis not present

## 2019-03-19 DIAGNOSIS — M75121 Complete rotator cuff tear or rupture of right shoulder, not specified as traumatic: Secondary | ICD-10-CM | POA: Diagnosis not present

## 2019-03-20 DIAGNOSIS — M25611 Stiffness of right shoulder, not elsewhere classified: Secondary | ICD-10-CM | POA: Diagnosis not present

## 2019-03-20 DIAGNOSIS — M75121 Complete rotator cuff tear or rupture of right shoulder, not specified as traumatic: Secondary | ICD-10-CM | POA: Diagnosis not present

## 2019-03-25 DIAGNOSIS — M25611 Stiffness of right shoulder, not elsewhere classified: Secondary | ICD-10-CM | POA: Diagnosis not present

## 2019-03-25 DIAGNOSIS — M75121 Complete rotator cuff tear or rupture of right shoulder, not specified as traumatic: Secondary | ICD-10-CM | POA: Diagnosis not present

## 2019-03-27 DIAGNOSIS — M75121 Complete rotator cuff tear or rupture of right shoulder, not specified as traumatic: Secondary | ICD-10-CM | POA: Diagnosis not present

## 2019-03-27 DIAGNOSIS — M25611 Stiffness of right shoulder, not elsewhere classified: Secondary | ICD-10-CM | POA: Diagnosis not present

## 2019-04-08 DIAGNOSIS — M25611 Stiffness of right shoulder, not elsewhere classified: Secondary | ICD-10-CM | POA: Diagnosis not present

## 2019-04-08 DIAGNOSIS — M75121 Complete rotator cuff tear or rupture of right shoulder, not specified as traumatic: Secondary | ICD-10-CM | POA: Diagnosis not present

## 2019-04-10 DIAGNOSIS — M25611 Stiffness of right shoulder, not elsewhere classified: Secondary | ICD-10-CM | POA: Diagnosis not present

## 2019-04-10 DIAGNOSIS — M75121 Complete rotator cuff tear or rupture of right shoulder, not specified as traumatic: Secondary | ICD-10-CM | POA: Diagnosis not present

## 2019-04-17 DIAGNOSIS — M75121 Complete rotator cuff tear or rupture of right shoulder, not specified as traumatic: Secondary | ICD-10-CM | POA: Diagnosis not present

## 2019-04-17 DIAGNOSIS — M25611 Stiffness of right shoulder, not elsewhere classified: Secondary | ICD-10-CM | POA: Diagnosis not present

## 2019-04-22 DIAGNOSIS — M25611 Stiffness of right shoulder, not elsewhere classified: Secondary | ICD-10-CM | POA: Diagnosis not present

## 2019-04-22 DIAGNOSIS — M75121 Complete rotator cuff tear or rupture of right shoulder, not specified as traumatic: Secondary | ICD-10-CM | POA: Diagnosis not present

## 2019-04-23 DIAGNOSIS — M75121 Complete rotator cuff tear or rupture of right shoulder, not specified as traumatic: Secondary | ICD-10-CM | POA: Diagnosis not present

## 2019-04-24 DIAGNOSIS — M75121 Complete rotator cuff tear or rupture of right shoulder, not specified as traumatic: Secondary | ICD-10-CM | POA: Diagnosis not present

## 2019-04-24 DIAGNOSIS — M25611 Stiffness of right shoulder, not elsewhere classified: Secondary | ICD-10-CM | POA: Diagnosis not present

## 2019-04-29 DIAGNOSIS — M75121 Complete rotator cuff tear or rupture of right shoulder, not specified as traumatic: Secondary | ICD-10-CM | POA: Diagnosis not present

## 2019-04-29 DIAGNOSIS — M25611 Stiffness of right shoulder, not elsewhere classified: Secondary | ICD-10-CM | POA: Diagnosis not present

## 2019-05-01 DIAGNOSIS — M25611 Stiffness of right shoulder, not elsewhere classified: Secondary | ICD-10-CM | POA: Diagnosis not present

## 2019-05-01 DIAGNOSIS — M75121 Complete rotator cuff tear or rupture of right shoulder, not specified as traumatic: Secondary | ICD-10-CM | POA: Diagnosis not present

## 2019-05-06 DIAGNOSIS — M75121 Complete rotator cuff tear or rupture of right shoulder, not specified as traumatic: Secondary | ICD-10-CM | POA: Diagnosis not present

## 2019-05-06 DIAGNOSIS — M25611 Stiffness of right shoulder, not elsewhere classified: Secondary | ICD-10-CM | POA: Diagnosis not present

## 2019-05-13 DIAGNOSIS — M75121 Complete rotator cuff tear or rupture of right shoulder, not specified as traumatic: Secondary | ICD-10-CM | POA: Diagnosis not present

## 2019-05-13 DIAGNOSIS — M25611 Stiffness of right shoulder, not elsewhere classified: Secondary | ICD-10-CM | POA: Diagnosis not present

## 2019-05-15 DIAGNOSIS — M25611 Stiffness of right shoulder, not elsewhere classified: Secondary | ICD-10-CM | POA: Diagnosis not present

## 2019-05-15 DIAGNOSIS — M75121 Complete rotator cuff tear or rupture of right shoulder, not specified as traumatic: Secondary | ICD-10-CM | POA: Diagnosis not present

## 2019-05-21 DIAGNOSIS — M75121 Complete rotator cuff tear or rupture of right shoulder, not specified as traumatic: Secondary | ICD-10-CM | POA: Diagnosis not present

## 2019-05-21 DIAGNOSIS — M25611 Stiffness of right shoulder, not elsewhere classified: Secondary | ICD-10-CM | POA: Diagnosis not present

## 2019-05-27 DIAGNOSIS — M25611 Stiffness of right shoulder, not elsewhere classified: Secondary | ICD-10-CM | POA: Diagnosis not present

## 2019-05-27 DIAGNOSIS — M75121 Complete rotator cuff tear or rupture of right shoulder, not specified as traumatic: Secondary | ICD-10-CM | POA: Diagnosis not present

## 2019-05-29 DIAGNOSIS — M75121 Complete rotator cuff tear or rupture of right shoulder, not specified as traumatic: Secondary | ICD-10-CM | POA: Diagnosis not present

## 2019-05-29 DIAGNOSIS — M25611 Stiffness of right shoulder, not elsewhere classified: Secondary | ICD-10-CM | POA: Diagnosis not present

## 2019-06-02 DIAGNOSIS — M25611 Stiffness of right shoulder, not elsewhere classified: Secondary | ICD-10-CM | POA: Diagnosis not present

## 2019-06-03 DIAGNOSIS — M75121 Complete rotator cuff tear or rupture of right shoulder, not specified as traumatic: Secondary | ICD-10-CM | POA: Diagnosis not present

## 2019-06-03 DIAGNOSIS — M25611 Stiffness of right shoulder, not elsewhere classified: Secondary | ICD-10-CM | POA: Diagnosis not present

## 2019-06-05 DIAGNOSIS — M25611 Stiffness of right shoulder, not elsewhere classified: Secondary | ICD-10-CM | POA: Diagnosis not present

## 2019-06-05 DIAGNOSIS — M75121 Complete rotator cuff tear or rupture of right shoulder, not specified as traumatic: Secondary | ICD-10-CM | POA: Diagnosis not present

## 2019-06-10 DIAGNOSIS — M25611 Stiffness of right shoulder, not elsewhere classified: Secondary | ICD-10-CM | POA: Diagnosis not present

## 2019-06-10 DIAGNOSIS — M75121 Complete rotator cuff tear or rupture of right shoulder, not specified as traumatic: Secondary | ICD-10-CM | POA: Diagnosis not present

## 2019-06-12 DIAGNOSIS — M75121 Complete rotator cuff tear or rupture of right shoulder, not specified as traumatic: Secondary | ICD-10-CM | POA: Diagnosis not present

## 2019-06-12 DIAGNOSIS — M25611 Stiffness of right shoulder, not elsewhere classified: Secondary | ICD-10-CM | POA: Diagnosis not present

## 2019-07-16 DIAGNOSIS — M75121 Complete rotator cuff tear or rupture of right shoulder, not specified as traumatic: Secondary | ICD-10-CM | POA: Diagnosis not present

## 2019-07-27 IMAGING — CT CT CHEST W/O CM
2 of 4 series · 12 of 36 positions shown, 15 images · non-contrast
Comparison: Chest CT April 2017

CLINICAL DATA: Pulmonary nodule

EXAM:
CT CHEST WITHOUT CONTRAST
TECHNIQUE: Multidetector CT imaging of the chest was performed following the
standard protocol without IV contrast.

[Series 2: chest 2.00 br40 s3 ax · axial · 0.63mm/px · z∈[+1752,+2010]mm · 9 of 153 slices shown, 12 images]
[im 12/153  mediastinal]
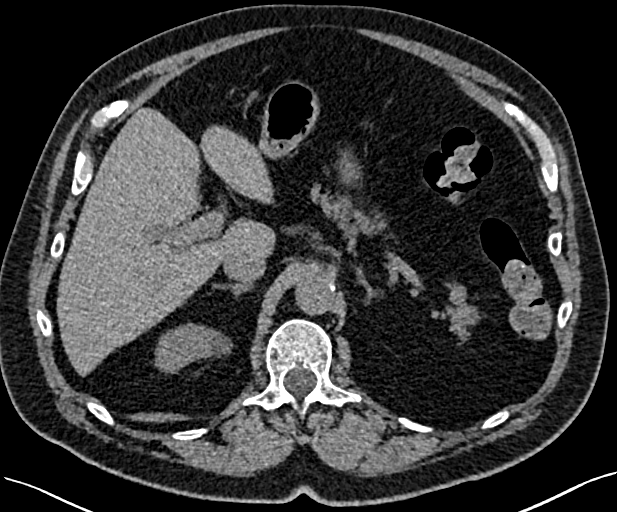
[im 12/153  lung]
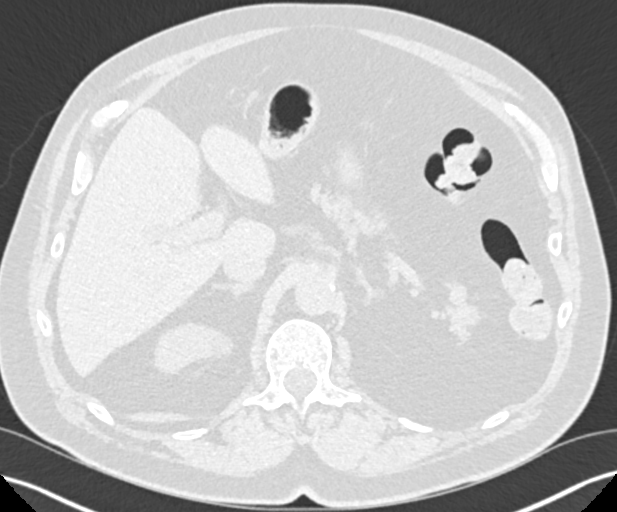
[im 36/153  lung]
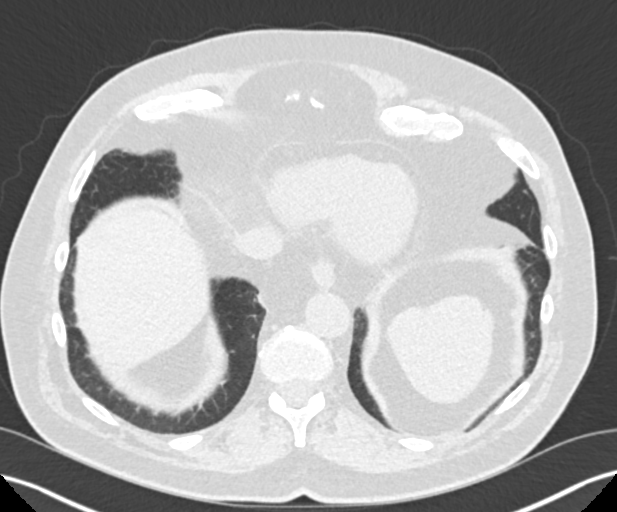
[im 47/153  lung]
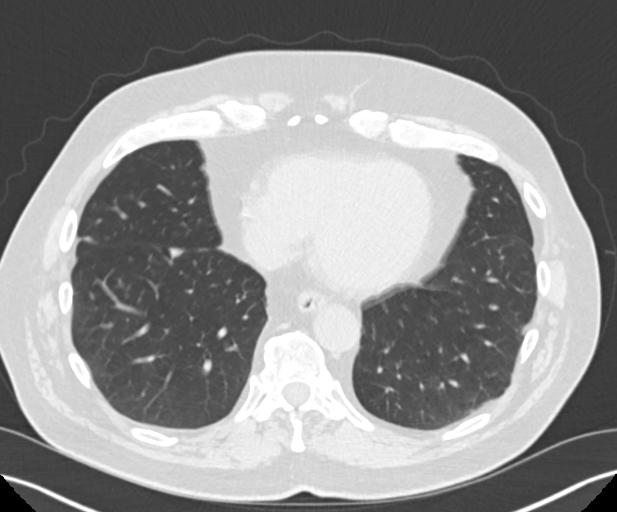
[im 59/153  lung]
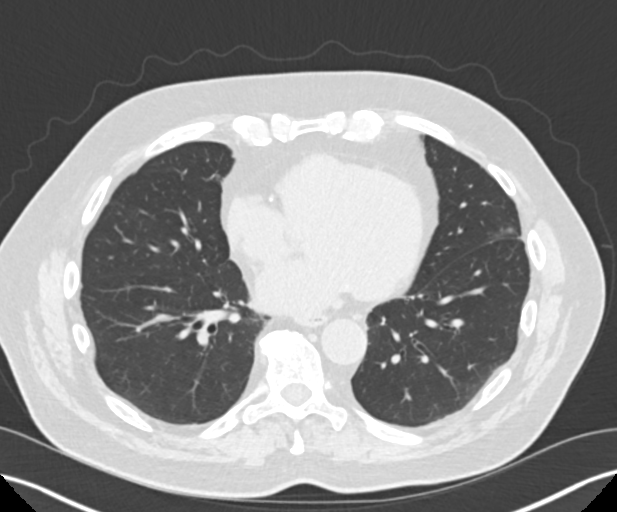
[im 82/153  mediastinal]
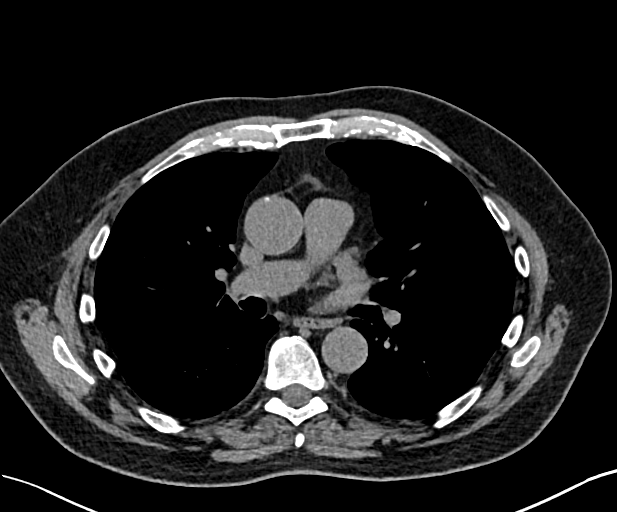
[im 82/153  lung]
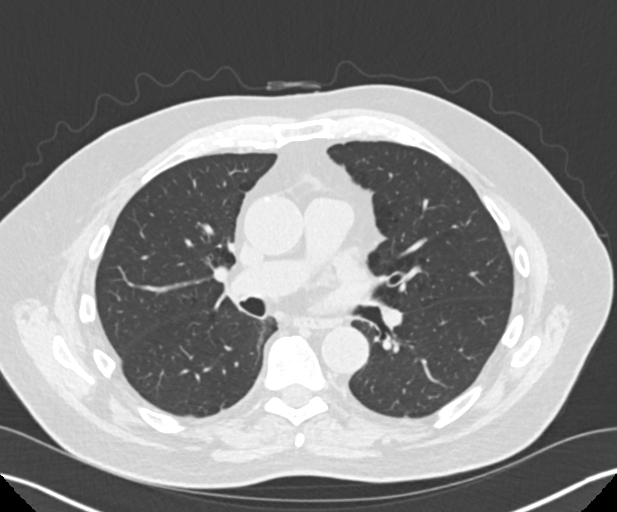
[im 94/153  lung]
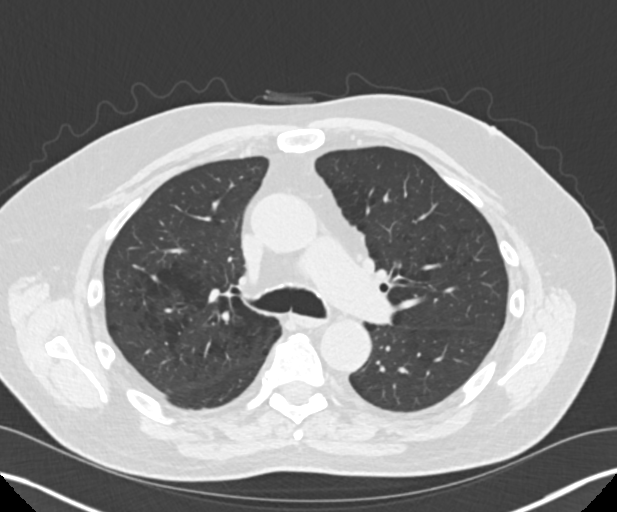
[im 106/153  lung]
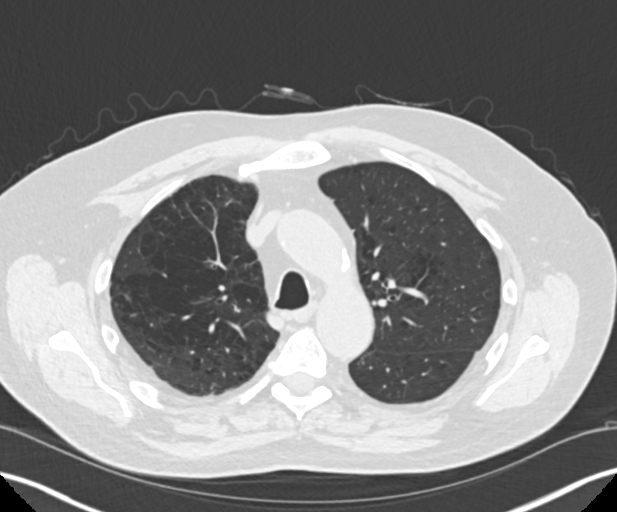
[im 129/153  lung]
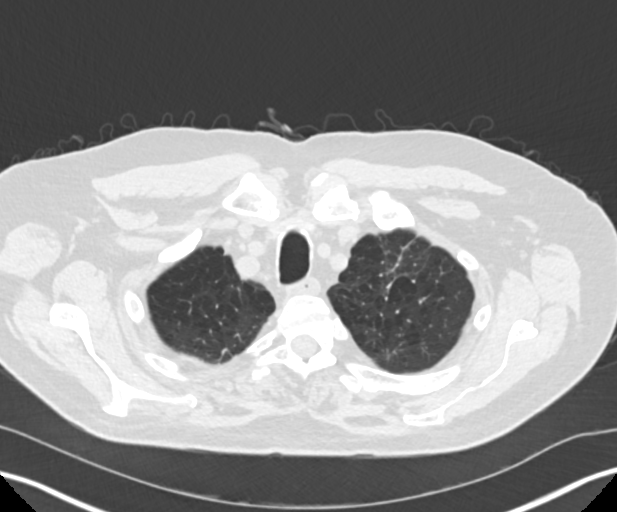
[im 141/153  mediastinal]
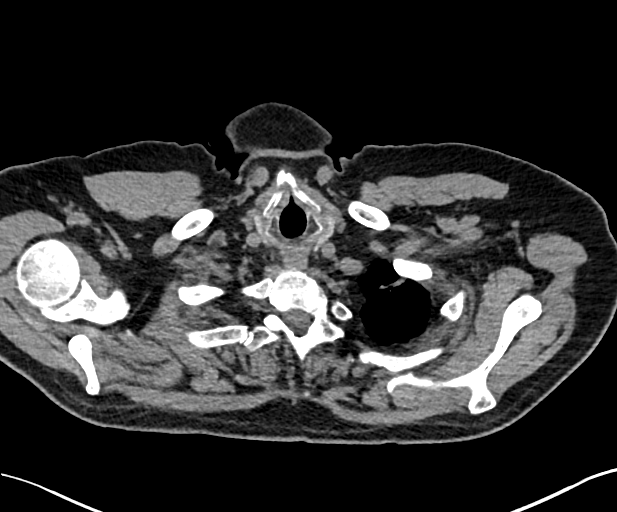
[im 141/153  lung]
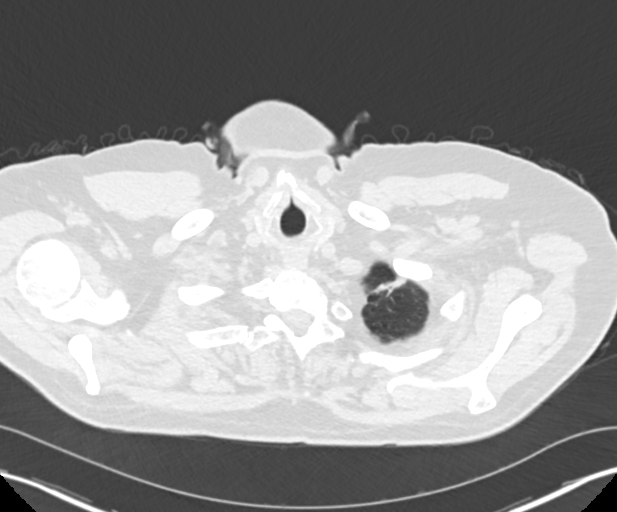

[Series 4: chest 2.00 br40 s3 cor · coronal · 0.60mm/px · 3 of 160 slices shown]
[im 32/160  lung]
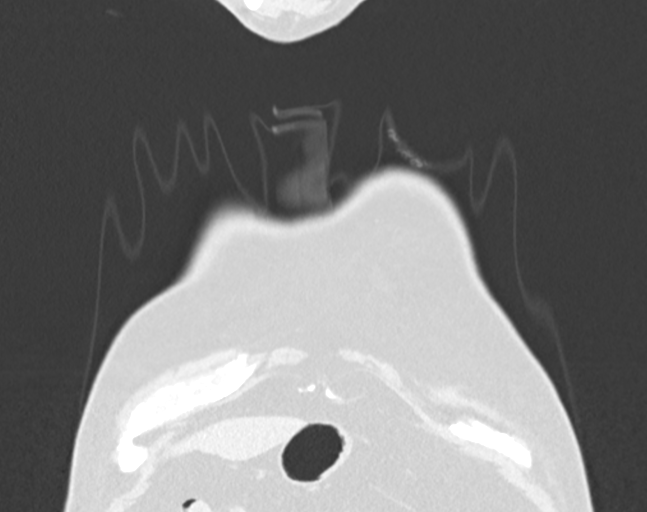
[im 64/160  lung]
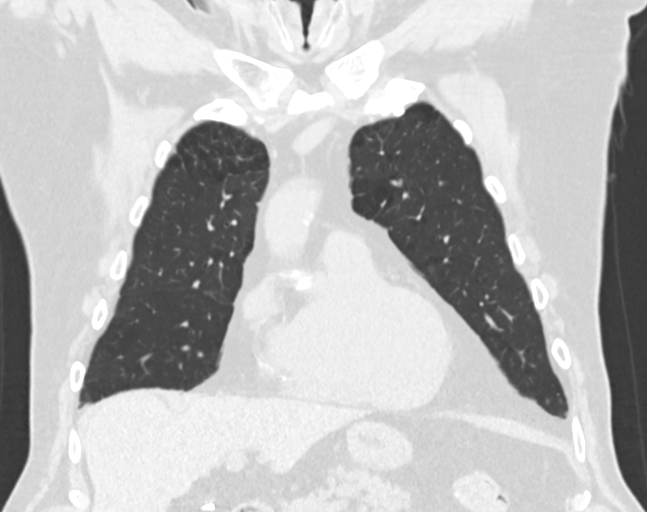
[im 96/160  lung]
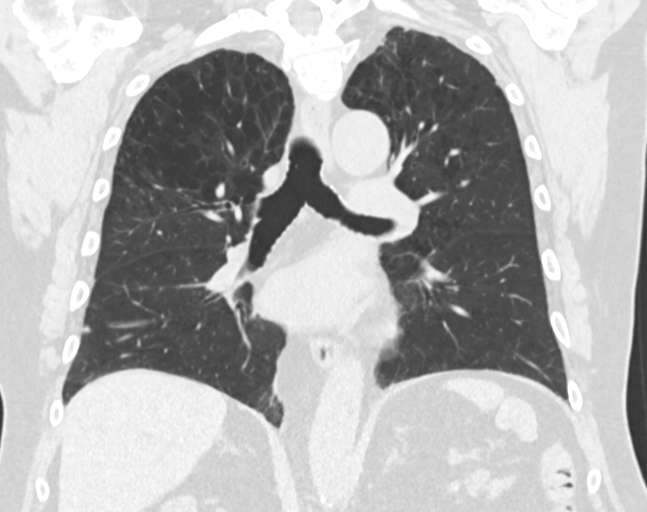

[12 of 36 positions shown; findings below may reference images not displayed]

FINDINGS: Cardiovascular: There is no thoracic aortic aneurysm. Visualized
great vessels appear unremarkable. There are foci of atherosclerotic
calcification aorta. There are foci coronary artery calcification.
There is no pericardial effusion or pericardial thickening.

Mediastinum/Nodes: Thyroid appears normal. There is no appreciable
thoracic adenopathy the. No esophageal lesions are evident.

Lungs/Pleura: There is underlying emphysematous change. There is
scarring in the anterior left apex, stable. On axial slice 26 series
8, there is a stable 2 mm nodular opacity in the posterior segment
of the left upper lobe near the apex. On axial slice 28 series 8,
there is a stable 3 mm nodular opacity in the posterior segment
right upper lobe. On axial slice 98 series 8, there is a 5 x 5 mm
nodular opacity abutting the pleura in the superior segment right
lower lobe, slightly smaller than on previous study. On axial slice
97 series 8, there is a 3 mm nodular opacity in the medial segment
right middle lobe, stable. On axial slice 96 series 8, there is a 5
mm nodular opacity in the medial segment right middle lobe, stable.
On axial slice 107 series 8, there is a stable 7 x 7 mm nodular
opacity abutting the minor fissure, a likely intrafissural lymph
node. On axial slice 110, there is a stable 4 mm nodular opacity in
the left lower lobe adjacent to the left hemidiaphragm. On axial
slice 108 series 8, there is a stable 6 x 5 mm nodular opacity
abutting the pleura in the lateral segment of the left lower lobe.
No new parenchymal lung lesions are evident. There is no lung edema
or consolidation. No evident pleural effusion or pleural thickening.

Upper Abdomen: Gallbladder is absent. There is atherosclerotic
calcification in the visualized upper abdominal region.

Musculoskeletal: There is degenerative change in the thoracic spine.
There is postoperative change in lower cervical spine. There are no
blastic or lytic bone lesions. No evident chest wall lesions.
IMPRESSION: 1. Multiple pulmonary nodular lesions are either stable or slightly
smaller. No new pulmonary nodular lesion. There is underlying
centrilobular emphysematous change. No edema or consolidation.

2.  No evident adenopathy.

3. Aortic atherosclerosis. Foci of coronary artery calcification
noted.

Aortic Atherosclerosis (PF3Y8-OZ3.3) and Emphysema (PF3Y8-W9P.W).

## 2019-07-31 DIAGNOSIS — H353131 Nonexudative age-related macular degeneration, bilateral, early dry stage: Secondary | ICD-10-CM | POA: Diagnosis not present

## 2019-07-31 DIAGNOSIS — H524 Presbyopia: Secondary | ICD-10-CM | POA: Diagnosis not present

## 2019-07-31 DIAGNOSIS — H2513 Age-related nuclear cataract, bilateral: Secondary | ICD-10-CM | POA: Diagnosis not present

## 2019-08-19 DIAGNOSIS — R69 Illness, unspecified: Secondary | ICD-10-CM | POA: Diagnosis not present

## 2019-09-01 DIAGNOSIS — R69 Illness, unspecified: Secondary | ICD-10-CM | POA: Diagnosis not present

## 2019-09-23 DIAGNOSIS — R69 Illness, unspecified: Secondary | ICD-10-CM | POA: Diagnosis not present

## 2019-10-14 DIAGNOSIS — Z86010 Personal history of colonic polyps: Secondary | ICD-10-CM | POA: Diagnosis not present

## 2019-10-14 DIAGNOSIS — I10 Essential (primary) hypertension: Secondary | ICD-10-CM | POA: Diagnosis not present

## 2019-10-14 DIAGNOSIS — Z0000 Encounter for general adult medical examination without abnormal findings: Secondary | ICD-10-CM | POA: Diagnosis not present

## 2019-10-14 DIAGNOSIS — Z1159 Encounter for screening for other viral diseases: Secondary | ICD-10-CM | POA: Diagnosis not present

## 2019-10-14 DIAGNOSIS — E7800 Pure hypercholesterolemia, unspecified: Secondary | ICD-10-CM | POA: Diagnosis not present

## 2019-10-14 DIAGNOSIS — Z1389 Encounter for screening for other disorder: Secondary | ICD-10-CM | POA: Diagnosis not present

## 2019-11-18 ENCOUNTER — Telehealth: Payer: Self-pay | Admitting: Cardiology

## 2019-11-18 ENCOUNTER — Ambulatory Visit: Payer: Medicare HMO | Admitting: Cardiology

## 2019-11-18 ENCOUNTER — Encounter: Payer: Self-pay | Admitting: Cardiology

## 2019-11-18 ENCOUNTER — Other Ambulatory Visit: Payer: Self-pay

## 2019-11-18 VITALS — BP 142/80 | HR 78 | Ht 67.0 in | Wt 189.4 lb

## 2019-11-18 DIAGNOSIS — I471 Supraventricular tachycardia: Secondary | ICD-10-CM | POA: Diagnosis not present

## 2019-11-18 DIAGNOSIS — I10 Essential (primary) hypertension: Secondary | ICD-10-CM | POA: Diagnosis not present

## 2019-11-18 DIAGNOSIS — I2510 Atherosclerotic heart disease of native coronary artery without angina pectoris: Secondary | ICD-10-CM | POA: Diagnosis not present

## 2019-11-18 DIAGNOSIS — I456 Pre-excitation syndrome: Secondary | ICD-10-CM

## 2019-11-18 DIAGNOSIS — I2584 Coronary atherosclerosis due to calcified coronary lesion: Secondary | ICD-10-CM

## 2019-11-18 DIAGNOSIS — I700 Atherosclerosis of aorta: Secondary | ICD-10-CM

## 2019-11-18 MED ORDER — FLECAINIDE ACETATE 100 MG PO TABS: 100.0000 mg | ORAL_TABLET | Freq: Two times a day (BID) | ORAL | 3 refills | Status: DC

## 2019-11-18 MED ORDER — ATORVASTATIN CALCIUM 20 MG PO TABS: 20.0000 mg | ORAL_TABLET | Freq: Every day | ORAL | 3 refills | Status: DC

## 2019-11-18 NOTE — Telephone Encounter (Signed)
Called and spoke with pharmacist at [facility] regarding a tier exception for Flecainide.   Reviewed clinical information that pt had an ablation 04/20/2011 and has been taking and stable on Flecainide since 06/20/2011.  DX: WPW 145.6 and SVT.  Carloyn, pharmacist gave Tier exception of a level 1 that will be good through 11/2020.

## 2019-11-18 NOTE — Patient Instructions (Signed)
Medication Instructions:  Your physician recommends that you continue on your current medications as directed. Please refer to the Current Medication list given to you today.  *If you need a refill on your cardiac medications before your next appointment, please call your pharmacy*  Follow-Up: At [facility], you and your health needs are our priority.  As part of our continuing mission to provide you with exceptional heart care, we have created designated Provider Care Teams.  These Care Teams include your primary Cardiologist (physician) and Advanced Practice Providers (APPs -  Physician Assistants and Nurse Practitioners) who all work together to provide you with the care you need, when you need it.  Your next appointment:   1 year(s)  The format for your next appointment:   In Person  Provider:   Candee Furbish, MD

## 2019-11-18 NOTE — Telephone Encounter (Signed)
Wolff-Parkinson-White syndrome I45.6

## 2019-11-18 NOTE — Telephone Encounter (Signed)
New Message   Jaci Standard from [place] is calling and needs to know the diagnosis and the alternatives  For  flecainide (TAMBOCOR) 100 MG tablet  Please call

## 2019-11-18 NOTE — Progress Notes (Signed)
Cardiology Office Note:    Date:  11/18/2019   ID:  [name], DOB 26-Sep-1946, MRN [id number]  PCP:  Lavone Orn, MD  Cardiologist:  Candee Furbish, MD  Electrophysiologist:  None   Referring MD: Lavone Orn, MD     History of Present Illness:    [name] is a 73 y.o. male here for follow-up with WPW post ablation Dr. Lovena Le 2012 on flecanide 100 mg twice a day.  EF normal.    Overall he is been doing quite well.  He enjoyed his travels to [place] in [place] to visit his son.   Did review CT scan which showed aortic atherosclerosis and coronary artery disease consultation.  Changed his statin as below.  Doing well.  No fevers chills nausea vomiting syncope bleeding.  LDL cholesterol 40.  Creatinine 1.2.  BP at home 140-150.    Past Medical History:  Diagnosis Date   Aortic regurgitation    moderate   Arthritis    Chronic back pain    Hyperlipidemia    Hypertension    Seasonal allergies    Wolff-Parkinson-White syndrome     Past Surgical History:  Procedure Laterality Date   APPENDECTOMY     CERVICAL SPINE SURGERY     CHOLECYSTECTOMY     KNEE SURGERY      Current Medications: Current Meds  Medication Sig   acetaminophen (TYLENOL) 500 MG tablet Take 1,000 mg by mouth every 6 (six) hours as needed.     aspirin 81 MG tablet Take 81 mg by mouth daily.    flecainide (TAMBOCOR) 100 MG tablet Take 1 tablet (100 mg total) by mouth 2 (two) times daily.   fluticasone (FLONASE) 50 MCG/ACT nasal spray Place 2 sprays into both nostrils daily.   multivitamin (THERAGRAN) per tablet Take 1 tablet by mouth daily.     NON FORMULARY Max vision eye vitamin. 1 tablet daily    telmisartan (MICARDIS) 80 MG tablet Take 80 mg by mouth daily.   [DISCONTINUED] flecainide (TAMBOCOR) 100 MG tablet Take 1 tablet (100 mg total) by mouth 2 (two) times daily.     Allergies:   Lisinopril   Social History   Socioeconomic History   Marital status:  Married    Spouse name: Not on file   Number of children: 2   Years of education: Not on file   Highest education level: Not on file  Occupational History   Not on file  Social Needs   Financial resource strain: Not on file   Food insecurity    Worry: Not on file    Inability: Not on file   Transportation needs    Medical: Not on file    Non-medical: Not on file  Tobacco Use   Smoking status: Former Smoker    Quit date: 12/25/2002    Years since quitting: 16.9   Smokeless tobacco: Never Used  Substance and Sexual Activity   Alcohol use: No   Drug use: No   Sexual activity: Not on file  Lifestyle   Physical activity    Days per week: Not on file    Minutes per session: Not on file   Stress: Not on file  Relationships   Social connections    Talks on phone: Not on file    Gets together: Not on file    Attends religious service: Not on file    Active member of club or organization: Not on file    Attends meetings of  clubs or organizations: Not on file    Relationship status: Not on file  Other Topics Concern   Not on file  Social History Narrative   Not on file     Family History: The patient's family history includes Other in his mother; Stroke (age of onset: 38) in his father.  ROS:   Please see the history of present illness.     All other systems reviewed and are negative.  EKGs/Labs/Other Studies Reviewed:    The following studies were reviewed today: ECHO 2016 - Left ventricle: The cavity size was normal. Wall thickness was   increased in a pattern of mild LVH. Systolic function was normal.   The estimated ejection fraction was in the range of 50% to 55%.   Wall motion was normal; there were no regional wall motion   abnormalities. Doppler parameters are consistent with abnormal   left ventricular relaxation (grade 1 diastolic dysfunction). - Aortic valve: There was mild regurgitation.  EKG:  EKG is  ordered today.  The ekg ordered today  demonstrates 11/18/2019-sinus rhythm 73 QRS 118 no change from prior QRS duration 118, normal sinus rhythm with nonspecific interventricular conduction delay.  Prior QRS duration was a proximally 110 ms.  Personally reviewed and interpreted.  Recent Labs: No results found for requested labs within last 8760 hours.  Recent Lipid Panel No results found for: CHOL, TRIG, HDL, CHOLHDL, VLDL, LDLCALC, LDLDIRECT  Physical Exam:    VS:  BP (!) 142/80    Pulse 78    Ht 5\' 7"  (1.702 m)    Wt 189 lb 6.4 oz (85.9 kg)    SpO2 95%    BMI 29.66 kg/m     Wt Readings from Last 3 Encounters:  11/18/19 189 lb 6.4 oz (85.9 kg)  11/13/18 185 lb 12.8 oz (84.3 kg)  11/23/17 188 lb 8 oz (85.5 kg)     GEN:  Well nourished, well developed in no acute distress HEENT: Normal NECK: No JVD; No carotid bruits LYMPHATICS: No lymphadenopathy CARDIAC: RRR, no murmurs, rubs, gallops RESPIRATORY:  Clear to auscultation without rales, wheezing or rhonchi  ABDOMEN: Soft, non-tender, non-distended MUSCULOSKELETAL:  No edema; No deformity  SKIN: Warm and dry NEUROLOGIC:  Alert and oriented x 3 PSYCHIATRIC:  Normal affect   ASSESSMENT:    1. Paroxysmal SVT (supraventricular tachycardia) ([place])   2. Essential hypertension   3. WPW (Wolff-Parkinson-White syndrome)   4. Coronary artery calcification   5. Aortic atherosclerosis (HCC)    PLAN:    In order of problems listed above:  WPW - Flecanide 100 mg twice a day.  EKG unremarkable.   Dr. Lovena Le. Post ablation, no further recurrence.  QRS duration 118 once again  PVCs -Flecanide.  Exercise treadmill test showed no ischemic changes, no VT, no significant widening of QRS.  Doing well.  No palpitations.  Mild aortic regurgitation - Heart murmur.  Continue to monitor clinically.  Essential hypertension -Medications reviewed.  Slightly elevated today but continue to monitor.  Dr. Laurann Montana is watching.  Currently on telmisartan 80.  Hyperlipidemia - On  atorvastatin 20 mg once a day.  Aortic atherosclerosis and coronary calcification noted on CT scan personally reviewed on 05/14/2018.  Last LDL was 40.  Creatinine 1.2.  Excellent.  No myalgias.  Doing well.  Aortic atherosclerosis - Continue with statin.  Secondary prevention.  No changes made   Medication Adjustments/Labs and Tests Ordered: Current medicines are reviewed at length with the patient today.  Concerns regarding  medicines are outlined above.  Orders Placed This Encounter  Procedures   EKG 12-Lead   Meds ordered this encounter  Medications   atorvastatin (LIPITOR) 20 MG tablet    Sig: Take 1 tablet (20 mg total) by mouth daily.    Dispense:  90 tablet    Refill:  3   flecainide (TAMBOCOR) 100 MG tablet    Sig: Take 1 tablet (100 mg total) by mouth 2 (two) times daily.    Dispense:  180 tablet    Refill:  3    Please hold RX until pt calls for it to be filled    Patient Instructions  Medication Instructions:  Your physician recommends that you continue on your current medications as directed. Please refer to the Current Medication list given to you today.  *If you need a refill on your cardiac medications before your next appointment, please call your pharmacy*  Follow-Up: At [facility], you and your health needs are our priority.  As part of our continuing mission to provide you with exceptional heart care, we have created designated Provider Care Teams.  These Care Teams include your primary Cardiologist (physician) and Advanced Practice Providers (APPs -  Physician Assistants and Nurse Practitioners) who all work together to provide you with the care you need, when you need it.  Your next appointment:   1 year(s)  The format for your next appointment:   In Person  Provider:   Candee Furbish, MD      Signed, Candee Furbish, MD  11/18/2019 8:28 AM    [place]

## 2020-01-13 DIAGNOSIS — I129 Hypertensive chronic kidney disease with stage 1 through stage 4 chronic kidney disease, or unspecified chronic kidney disease: Secondary | ICD-10-CM | POA: Diagnosis not present

## 2020-01-13 DIAGNOSIS — K5790 Diverticulosis of intestine, part unspecified, without perforation or abscess without bleeding: Secondary | ICD-10-CM | POA: Diagnosis not present

## 2020-01-13 DIAGNOSIS — Z86010 Personal history of colonic polyps: Secondary | ICD-10-CM | POA: Diagnosis not present

## 2020-01-25 ENCOUNTER — Ambulatory Visit: Payer: Medicare HMO

## 2020-01-30 ENCOUNTER — Ambulatory Visit: Payer: Medicare HMO

## 2020-01-30 DIAGNOSIS — H353122 Nonexudative age-related macular degeneration, left eye, intermediate dry stage: Secondary | ICD-10-CM | POA: Diagnosis not present

## 2020-01-30 DIAGNOSIS — H353111 Nonexudative age-related macular degeneration, right eye, early dry stage: Secondary | ICD-10-CM | POA: Diagnosis not present

## 2020-01-30 DIAGNOSIS — H2513 Age-related nuclear cataract, bilateral: Secondary | ICD-10-CM | POA: Diagnosis not present

## 2020-01-31 ENCOUNTER — Ambulatory Visit: Payer: Medicare HMO | Attending: Internal Medicine

## 2020-01-31 ENCOUNTER — Ambulatory Visit: Payer: Medicare HMO

## 2020-01-31 DIAGNOSIS — Z23 Encounter for immunization: Secondary | ICD-10-CM | POA: Insufficient documentation

## 2020-01-31 NOTE — Progress Notes (Signed)
   Covid-19 Vaccination Clinic  Name:  [name]    MRN: [id number] DOB: [birth year]  01/31/2020  [name] was observed post Covid-19 immunization for 15 minutes without incidence. He was provided with Vaccine Information Sheet and instruction to access the V-Safe system.   [name] was instructed to call 911 with any severe reactions post vaccine: Marland Kitchen Difficulty breathing  . Swelling of your face and throat  . A fast heartbeat  . A bad rash all over your body  . Dizziness and weakness    Immunizations Administered    Name Date Dose VIS Date Route   Pfizer COVID-19 Vaccine 01/31/2020  8:15 AM 0.3 mL 12/05/2019 Intramuscular   Manufacturer: [place]   Lot: [id number]   [place]: [id number]

## 2020-02-12 DIAGNOSIS — Z1159 Encounter for screening for other viral diseases: Secondary | ICD-10-CM | POA: Diagnosis not present

## 2020-02-17 DIAGNOSIS — Z86010 Personal history of colonic polyps: Secondary | ICD-10-CM | POA: Diagnosis not present

## 2020-02-17 DIAGNOSIS — K5730 Diverticulosis of large intestine without perforation or abscess without bleeding: Secondary | ICD-10-CM | POA: Diagnosis not present

## 2020-02-23 ENCOUNTER — Ambulatory Visit: Payer: Medicare HMO | Attending: Internal Medicine

## 2020-02-23 DIAGNOSIS — Z23 Encounter for immunization: Secondary | ICD-10-CM | POA: Insufficient documentation

## 2020-02-23 NOTE — Progress Notes (Signed)
   Covid-19 Vaccination Clinic  Name:  [name]    MRN: [id number] DOB: November 15, 1946  02/23/2020  [name] was observed post Covid-19 immunization for 15 minutes without incidence. He was provided with Vaccine Information Sheet and instruction to access the V-Safe system.   [name] was instructed to call 911 with any severe reactions post vaccine: Marland Kitchen Difficulty breathing  . Swelling of your face and throat  . A fast heartbeat  . A bad rash all over your body  . Dizziness and weakness    Immunizations Administered    Name Date Dose VIS Date Route   Pfizer COVID-19 Vaccine 02/23/2020  1:12 PM 0.3 mL 12/05/2019 Intramuscular   Manufacturer: [place]   Lot: [id number]   [place]: [id number]

## 2020-02-24 ENCOUNTER — Ambulatory Visit: Payer: Self-pay

## 2020-02-24 DIAGNOSIS — R69 Illness, unspecified: Secondary | ICD-10-CM | POA: Diagnosis not present

## 2020-03-05 DIAGNOSIS — K5900 Constipation, unspecified: Secondary | ICD-10-CM | POA: Diagnosis not present

## 2020-03-05 DIAGNOSIS — R300 Dysuria: Secondary | ICD-10-CM | POA: Diagnosis not present

## 2020-03-05 DIAGNOSIS — R509 Fever, unspecified: Secondary | ICD-10-CM | POA: Diagnosis not present

## 2020-04-15 DIAGNOSIS — I10 Essential (primary) hypertension: Secondary | ICD-10-CM | POA: Diagnosis not present

## 2020-05-25 DIAGNOSIS — R69 Illness, unspecified: Secondary | ICD-10-CM | POA: Diagnosis not present

## 2020-08-02 DIAGNOSIS — H353111 Nonexudative age-related macular degeneration, right eye, early dry stage: Secondary | ICD-10-CM | POA: Diagnosis not present

## 2020-08-02 DIAGNOSIS — H2513 Age-related nuclear cataract, bilateral: Secondary | ICD-10-CM | POA: Diagnosis not present

## 2020-08-02 DIAGNOSIS — H353122 Nonexudative age-related macular degeneration, left eye, intermediate dry stage: Secondary | ICD-10-CM | POA: Diagnosis not present

## 2020-08-03 DIAGNOSIS — Z0100 Encounter for examination of eyes and vision without abnormal findings: Secondary | ICD-10-CM | POA: Diagnosis not present

## 2020-08-31 DIAGNOSIS — R69 Illness, unspecified: Secondary | ICD-10-CM | POA: Diagnosis not present

## 2020-09-29 DIAGNOSIS — R69 Illness, unspecified: Secondary | ICD-10-CM | POA: Diagnosis not present

## 2020-11-17 ENCOUNTER — Ambulatory Visit: Payer: Medicare HMO | Admitting: Cardiology

## 2020-11-17 ENCOUNTER — Other Ambulatory Visit: Payer: Self-pay

## 2020-11-17 ENCOUNTER — Encounter: Payer: Self-pay | Admitting: Cardiology

## 2020-11-17 VITALS — BP 120/70 | HR 75 | Ht 67.0 in | Wt 187.0 lb

## 2020-11-17 DIAGNOSIS — I351 Nonrheumatic aortic (valve) insufficiency: Secondary | ICD-10-CM | POA: Diagnosis not present

## 2020-11-17 DIAGNOSIS — I10 Essential (primary) hypertension: Secondary | ICD-10-CM | POA: Diagnosis not present

## 2020-11-17 DIAGNOSIS — I359 Nonrheumatic aortic valve disorder, unspecified: Secondary | ICD-10-CM

## 2020-11-17 NOTE — Patient Instructions (Signed)
Medication Instructions:  Please discontinue your Amlodipine.  Continue all other medications as listed.  *If you need a refill on your cardiac medications before your next appointment, please call your pharmacy*  Testing/Procedures: Your physician has requested that you have an echocardiogram. Echocardiography is a painless test that uses sound waves to create images of your heart. It provides your doctor with information about the size and shape of your heart and how well your heart's chambers and valves are working. This procedure takes approximately one hour. There are no restrictions for this procedure.  Follow-Up: At [facility], you and your health needs are our priority.  As part of our continuing mission to provide you with exceptional heart care, we have created designated Provider Care Teams.  These Care Teams include your primary Cardiologist (physician) and Advanced Practice Providers (APPs -  Physician Assistants and Nurse Practitioners) who all work together to provide you with the care you need, when you need it.  We recommend signing up for the patient portal called "MyChart".  Sign up information is provided on this After Visit Summary.  MyChart is used to connect with patients for Virtual Visits (Telemedicine).  Patients are able to view lab/test results, encounter notes, upcoming appointments, etc.  Non-urgent messages can be sent to your provider as well.   To learn more about what you can do with MyChart, go to [email or web address].    Your next appointment:   6 month(s)  The format for your next appointment:   In Person  Provider:   Candee Furbish, MD

## 2020-11-17 NOTE — Progress Notes (Signed)
Cardiology Office Note:    Date:  11/18/2020   ID:  [name], DOB [birth year], MRN [id number]  PCP:  Lavone Orn, MD  [facility] Cardiologist:  Candee Furbish, MD  [facility] Electrophysiologist:  None   Referring MD: Lavone Orn, MD     History of Present Illness:    [name] is a 74 y.o. male here for the follow-up of WPW post ablation with Dr. Lovena Le in 2012 on flecainide 100 mg twice a day.  Overall been doing quite well.  No fevers chills nausea vomiting syncope bleeding.  Previously we talked about his travels to [place] in [place] to visit his son.  4 weeks ago woke up with heart racing at 2am. Cough/holding breath. Seemed like 1 hour but really 20 min. After 2nd episode 2 days apart--lethargic 9am BP 90/60. 11:30 90/58. No F/C/N?V D Came back up.   [place] walking miles doing well now.   Prior heat stroke, theromstat been off.   Past Medical History:  Diagnosis Date  . Aortic regurgitation    moderate  . Arthritis   . Chronic back pain   . Hyperlipidemia   . Hypertension   . Seasonal allergies   . Wolff-Parkinson-White syndrome     Past Surgical History:  Procedure Laterality Date  . APPENDECTOMY    . CERVICAL SPINE SURGERY    . CHOLECYSTECTOMY    . KNEE SURGERY      Current Medications: Current Meds  Medication Sig  . acetaminophen (TYLENOL) 500 MG tablet Take 1,000 mg by mouth every 6 (six) hours as needed.    Marland Kitchen aspirin 81 MG tablet Take 81 mg by mouth daily.   Marland Kitchen atorvastatin (LIPITOR) 20 MG tablet Take 1 tablet (20 mg total) by mouth daily.  . flecainide (TAMBOCOR) 100 MG tablet Take 1 tablet (100 mg total) by mouth 2 (two) times daily.  . fluticasone (FLONASE) 50 MCG/ACT nasal spray Place 2 sprays into both nostrils daily.  . multivitamin (THERAGRAN) per tablet Take 1 tablet by mouth daily.    . NON FORMULARY Max vision eye vitamin. 1 tablet daily   . telmisartan (MICARDIS) 80 MG tablet Take 80 mg by mouth daily.  .  [DISCONTINUED] amLODipine (NORVASC) 5 MG tablet Take 5 mg by mouth daily.     Allergies:   Lisinopril   Social History   Socioeconomic History  . Marital status: Married    Spouse name: Not on file  . Number of children: 2  . Years of education: Not on file  . Highest education level: Not on file  Occupational History  . Not on file  Tobacco Use  . Smoking status: Former Smoker    Quit date: 12/25/2002    Years since quitting: 17.9  . Smokeless tobacco: Never Used  Vaping Use  . Vaping Use: Never used  Substance and Sexual Activity  . Alcohol use: No  . Drug use: No  . Sexual activity: Not on file  Other Topics Concern  . Not on file  Social History Narrative  . Not on file   Social Determinants of Health   Financial Resource Strain:   . Difficulty of Paying Living Expenses: Not on file  Food Insecurity:   . Worried About Charity fundraiser in the Last Year: Not on file  . Ran Out of Food in the Last Year: Not on file  Transportation Needs:   . Lack of Transportation (Medical): Not on file  . Lack of  Transportation (Non-Medical): Not on file  Physical Activity:   . Days of Exercise per Week: Not on file  . Minutes of Exercise per Session: Not on file  Stress:   . Feeling of Stress : Not on file  Social Connections:   . Frequency of Communication with Friends and Family: Not on file  . Frequency of Social Gatherings with Friends and Family: Not on file  . Attends Religious Services: Not on file  . Active Member of Clubs or Organizations: Not on file  . Attends Archivist Meetings: Not on file  . Marital Status: Not on file     Family History: The patient's family history includes Other in his mother; Stroke (age of onset: 14) in his father.  ROS:   Please see the history of present illness.     All other systems reviewed and are negative.  EKGs/Labs/Other Studies Reviewed:    The following studies were reviewed today:  Prior CT scan showed  aortic atherosclerosis and coronary artery disease calcification.  Echo 2016 EF 50 to 55% with mild LVH and grade 1 diastolic dysfunction with mild aortic valve regurgitation.  EKG:  EKG is  ordered today.  The ekg ordered today demonstrates sinus rhythm 75 QRS duration 120.  No significant change from prior.  Recent Labs: No results found for requested labs within last 8760 hours.  Recent Lipid Panel No results found for: CHOL, TRIG, HDL, CHOLHDL, VLDL, LDLCALC, LDLDIRECT   Risk Assessment/Calculations:       Physical Exam:    VS:  BP 120/70   Pulse 75   Ht 5\' 7"  (1.702 m)   Wt 187 lb (84.8 kg)   SpO2 97%   BMI 29.29 kg/m     Wt Readings from Last 3 Encounters:  11/17/20 187 lb (84.8 kg)  11/18/19 189 lb 6.4 oz (85.9 kg)  11/13/18 185 lb 12.8 oz (84.3 kg)     GEN:  Well nourished, well developed in no acute distress HEENT: Normal NECK: No JVD; No carotid bruits LYMPHATICS: No lymphadenopathy CARDIAC: RRR, no murmurs, rubs, gallops RESPIRATORY:  Clear to auscultation without rales, wheezing or rhonchi  ABDOMEN: Soft, non-tender, non-distended MUSCULOSKELETAL:  No edema; No deformity  SKIN: Warm and dry NEUROLOGIC:  Alert and oriented x 3 PSYCHIATRIC:  Normal affect   ASSESSMENT:    1. Essential hypertension   2. Nonrheumatic aortic valve insufficiency   3. Aortic valve disorder    PLAN:    In order of problems listed above:  WPW -Post ablation with Dr. Lovena Le.  He is on flecainide 100 mg twice a day.  QRS duration on ECG is unchanged at 120. -He did have an episode of palpitations as described above 2 of them. -If these return, we will monitor him.  Currently he is feeling well without any difficulty with exercise etc.  PVCs -Flecanide.  Exercise treadmill test showed no ischemic changes, no VT, no significant widening of QRS.    Watch with new palpitations.  Mild aortic regurgitation - Heart murmur.    We will go ahead and check an echocardiogram  especially in light of his recent return of palpitations in the middle of the night.  Essential hypertension -He has been on telmisartan 80 as well as amlodipine 5 mg.  Given his episode of hypotension, I will stop his amlodipine 5 mg a day.  He will continue to monitor his blood pressures at home.  Obviously if he starts to creep back up into  the 140s he may need to restart.  Hyperlipidemia - On atorvastatin 20 mg once a day.  Aortic atherosclerosis and coronary calcification noted on CT scan personally reviewed on 05/14/2018.  Last LDL was 40.  Creatinine 1.2.  Continuing to do well with no myalgias.  Aortic atherosclerosis - Continue with statin.  Secondary prevention.  No changes made.   Medication Adjustments/Labs and Tests Ordered: Current medicines are reviewed at length with the patient today.  Concerns regarding medicines are outlined above.  Orders Placed This Encounter  Procedures  . EKG 12-Lead  . ECHOCARDIOGRAM COMPLETE   No orders of the defined types were placed in this encounter.   Patient Instructions  Medication Instructions:  Please discontinue your Amlodipine.  Continue all other medications as listed.  *If you need a refill on your cardiac medications before your next appointment, please call your pharmacy*  Testing/Procedures: Your physician has requested that you have an echocardiogram. Echocardiography is a painless test that uses sound waves to create images of your heart. It provides your doctor with information about the size and shape of your heart and how well your heart's chambers and valves are working. This procedure takes approximately one hour. There are no restrictions for this procedure.  Follow-Up: At [facility], you and your health needs are our priority.  As part of our continuing mission to provide you with exceptional heart care, we have created designated Provider Care Teams.  These Care Teams include your primary Cardiologist (physician)  and Advanced Practice Providers (APPs -  Physician Assistants and Nurse Practitioners) who all work together to provide you with the care you need, when you need it.  We recommend signing up for the patient portal called "MyChart".  Sign up information is provided on this After Visit Summary.  MyChart is used to connect with patients for Virtual Visits (Telemedicine).  Patients are able to view lab/test results, encounter notes, upcoming appointments, etc.  Non-urgent messages can be sent to your provider as well.   To learn more about what you can do with MyChart, go to [email or web address].    Your next appointment:   6 month(s)  The format for your next appointment:   In Person  Provider:   Candee Furbish, MD       Signed, Candee Furbish, MD  11/18/2020 7:04 AM    [place]

## 2020-12-01 ENCOUNTER — Other Ambulatory Visit: Payer: Self-pay | Admitting: Cardiology

## 2020-12-03 DIAGNOSIS — J069 Acute upper respiratory infection, unspecified: Secondary | ICD-10-CM | POA: Diagnosis not present

## 2020-12-14 ENCOUNTER — Other Ambulatory Visit (HOSPITAL_COMMUNITY): Payer: Medicare HMO

## 2020-12-14 DIAGNOSIS — Z23 Encounter for immunization: Secondary | ICD-10-CM | POA: Diagnosis not present

## 2020-12-14 DIAGNOSIS — G4733 Obstructive sleep apnea (adult) (pediatric): Secondary | ICD-10-CM | POA: Diagnosis not present

## 2020-12-14 DIAGNOSIS — E7800 Pure hypercholesterolemia, unspecified: Secondary | ICD-10-CM | POA: Diagnosis not present

## 2020-12-14 DIAGNOSIS — Z0000 Encounter for general adult medical examination without abnormal findings: Secondary | ICD-10-CM | POA: Diagnosis not present

## 2020-12-14 DIAGNOSIS — Z87828 Personal history of other (healed) physical injury and trauma: Secondary | ICD-10-CM | POA: Diagnosis not present

## 2020-12-14 DIAGNOSIS — I129 Hypertensive chronic kidney disease with stage 1 through stage 4 chronic kidney disease, or unspecified chronic kidney disease: Secondary | ICD-10-CM | POA: Diagnosis not present

## 2020-12-14 DIAGNOSIS — N1831 Chronic kidney disease, stage 3a: Secondary | ICD-10-CM | POA: Diagnosis not present

## 2020-12-14 DIAGNOSIS — Z1389 Encounter for screening for other disorder: Secondary | ICD-10-CM | POA: Diagnosis not present

## 2020-12-15 ENCOUNTER — Other Ambulatory Visit: Payer: Self-pay

## 2020-12-15 ENCOUNTER — Ambulatory Visit (HOSPITAL_COMMUNITY): Payer: Medicare HMO | Attending: Cardiology

## 2020-12-15 DIAGNOSIS — I10 Essential (primary) hypertension: Secondary | ICD-10-CM | POA: Insufficient documentation

## 2020-12-15 DIAGNOSIS — I359 Nonrheumatic aortic valve disorder, unspecified: Secondary | ICD-10-CM | POA: Diagnosis not present

## 2020-12-15 LAB — ECHOCARDIOGRAM COMPLETE
Area-P 1/2: 3.37 cm2
P 1/2 time: 501 msec
S' Lateral: 2.7 cm

## 2020-12-17 ENCOUNTER — Other Ambulatory Visit: Payer: Self-pay | Admitting: Cardiology

## 2021-01-05 DIAGNOSIS — B078 Other viral warts: Secondary | ICD-10-CM | POA: Diagnosis not present

## 2021-01-05 DIAGNOSIS — X32XXXA Exposure to sunlight, initial encounter: Secondary | ICD-10-CM | POA: Diagnosis not present

## 2021-01-05 DIAGNOSIS — L570 Actinic keratosis: Secondary | ICD-10-CM | POA: Diagnosis not present

## 2021-01-24 DIAGNOSIS — U071 COVID-19: Secondary | ICD-10-CM | POA: Diagnosis not present

## 2021-05-06 DIAGNOSIS — Z20822 Contact with and (suspected) exposure to covid-19: Secondary | ICD-10-CM | POA: Diagnosis not present

## 2021-06-06 DIAGNOSIS — M25511 Pain in right shoulder: Secondary | ICD-10-CM | POA: Diagnosis not present

## 2021-06-06 DIAGNOSIS — M4722 Other spondylosis with radiculopathy, cervical region: Secondary | ICD-10-CM | POA: Diagnosis not present

## 2021-06-06 DIAGNOSIS — M25512 Pain in left shoulder: Secondary | ICD-10-CM | POA: Diagnosis not present

## 2021-06-08 DIAGNOSIS — M4722 Other spondylosis with radiculopathy, cervical region: Secondary | ICD-10-CM | POA: Diagnosis not present

## 2021-06-15 DIAGNOSIS — M4722 Other spondylosis with radiculopathy, cervical region: Secondary | ICD-10-CM | POA: Diagnosis not present

## 2021-06-22 DIAGNOSIS — M4722 Other spondylosis with radiculopathy, cervical region: Secondary | ICD-10-CM | POA: Diagnosis not present

## 2021-06-29 DIAGNOSIS — M4722 Other spondylosis with radiculopathy, cervical region: Secondary | ICD-10-CM | POA: Diagnosis not present

## 2021-07-06 DIAGNOSIS — M4722 Other spondylosis with radiculopathy, cervical region: Secondary | ICD-10-CM | POA: Diagnosis not present

## 2021-07-14 DIAGNOSIS — H5212 Myopia, left eye: Secondary | ICD-10-CM | POA: Diagnosis not present

## 2021-07-14 DIAGNOSIS — H52222 Regular astigmatism, left eye: Secondary | ICD-10-CM | POA: Diagnosis not present

## 2021-07-14 DIAGNOSIS — H5201 Hypermetropia, right eye: Secondary | ICD-10-CM | POA: Diagnosis not present

## 2021-07-15 ENCOUNTER — Ambulatory Visit: Payer: Medicare HMO | Admitting: Cardiology

## 2021-07-15 ENCOUNTER — Encounter: Payer: Self-pay | Admitting: Cardiology

## 2021-07-15 ENCOUNTER — Other Ambulatory Visit: Payer: Self-pay

## 2021-07-15 VITALS — BP 122/70 | HR 70 | Ht 67.0 in | Wt 189.2 lb

## 2021-07-15 DIAGNOSIS — I700 Atherosclerosis of aorta: Secondary | ICD-10-CM

## 2021-07-15 DIAGNOSIS — I456 Pre-excitation syndrome: Secondary | ICD-10-CM | POA: Diagnosis not present

## 2021-07-15 DIAGNOSIS — I10 Essential (primary) hypertension: Secondary | ICD-10-CM | POA: Diagnosis not present

## 2021-07-15 DIAGNOSIS — I2584 Coronary atherosclerosis due to calcified coronary lesion: Secondary | ICD-10-CM

## 2021-07-15 DIAGNOSIS — I2510 Atherosclerotic heart disease of native coronary artery without angina pectoris: Secondary | ICD-10-CM

## 2021-07-15 DIAGNOSIS — I471 Supraventricular tachycardia: Secondary | ICD-10-CM | POA: Diagnosis not present

## 2021-07-15 DIAGNOSIS — I351 Nonrheumatic aortic (valve) insufficiency: Secondary | ICD-10-CM | POA: Diagnosis not present

## 2021-07-15 NOTE — Progress Notes (Signed)
Cardiology Office Note:    Date:  07/15/2021   ID:  [name], DOB 1946-01-28, MRN [id number]  PCP:  Lavone Orn, MD   [facility] Providers Cardiologist:  Candee Furbish, MD     Referring MD: Lavone Orn, MD    History of Present Illness:    [name] is a 75 y.o. male here for the follow-up of hypertension as well as WPW post ablation by Dr. Lovena Le in 2012 on flecainide 100 mg twice a day.  Previously we had talked about his travels to both [place] as well as [place] to visit his son.  At last visit in November 2011 he recounted an episode where he woke up with his heart racing at 2 AM.  Tried maneuvers such as cough holding breath that seem to last approximately 20 minutes.  After second episode about 2 days apart he felt lethargic with blood pressure of 90/60.  Questionable nausea.  This ended up resolving.  He has had prior heatstroke.  His internal thermostat has been off he states.  Since that visit he has been down to [place] walking well without any difficulty.  He has salt tablets that he takes prior to golfing.  This helps prevent him from getting dizzy or dehydrated.  This is fine.  I did state that if he wanted to hold his telmisartan the morning of a golf outing that would be fine.  Monitor his blood pressures.  Since increasing the flecanide previously, his palpitations have markedly improved.  Recently enjoyed a cruise.  Past Medical History:  Diagnosis Date   Aortic regurgitation    moderate   Arthritis    Chronic back pain    Hyperlipidemia    Hypertension    Seasonal allergies    Wolff-Parkinson-White syndrome     Past Surgical History:  Procedure Laterality Date   APPENDECTOMY     CERVICAL SPINE SURGERY     CHOLECYSTECTOMY     KNEE SURGERY      Current Medications: Current Meds  Medication Sig   acetaminophen (TYLENOL) 500 MG tablet Take 1,000 mg by mouth every 6 (six) hours as needed.     aspirin 81 MG tablet Take 81 mg by  mouth daily.    atorvastatin (LIPITOR) 20 MG tablet Take 1 tablet by mouth once daily   flecainide (TAMBOCOR) 100 MG tablet Take 1 tablet by mouth twice daily   fluticasone (FLONASE) 50 MCG/ACT nasal spray Place 2 sprays into both nostrils daily.   multivitamin (THERAGRAN) per tablet Take 1 tablet by mouth daily.     NON FORMULARY Max vision eye vitamin. 1 tablet daily    telmisartan (MICARDIS) 80 MG tablet Take 80 mg by mouth daily.     Allergies:   Lisinopril   Social History   Socioeconomic History   Marital status: Married    Spouse name: Not on file   Number of children: 2   Years of education: Not on file   Highest education level: Not on file  Occupational History   Not on file  Tobacco Use   Smoking status: Former    Types: Cigarettes    Quit date: 12/25/2002    Years since quitting: 18.5   Smokeless tobacco: Never  Vaping Use   Vaping Use: Never used  Substance and Sexual Activity   Alcohol use: No   Drug use: No   Sexual activity: Not on file  Other Topics Concern   Not on file  Social History  Narrative   Not on file   Social Determinants of Health   Financial Resource Strain: Not on file  Food Insecurity: Not on file  Transportation Needs: Not on file  Physical Activity: Not on file  Stress: Not on file  Social Connections: Not on file     Family History: The patient's family history includes Other in his mother; Stroke (age of onset: 74) in his father.  ROS:   Please see the history of present illness.     All other systems reviewed and are negative.  EKGs/Labs/Other Studies Reviewed:    The following studies were reviewed today: CT scan previously demonstrated aortic atherosclerosis as well as coronary artery calcification.  Personally reviewed and interpreted.  Echo from 2016 showed EF of 50 to 55% with mild LVH and grade 1 diastolic dysfunction with mild aortic valve regurgitation.  Echo from 12/15/2020:   1. Left ventricular ejection  fraction, by estimation, is 55 to 60%. The  left ventricle has normal function.   2. The left ventricle has no regional wall motion abnormalities.   3. There is mild left ventricular hypertrophy of the basal-septal  segment.   4. Left ventricular diastolic parameters are consistent with Grade I  diastolic dysfunction (impaired relaxation).   5. Right ventricular systolic function is normal. The right ventricular  size is normal.   6. The mitral valve is normal in structure. Trivial mitral valve  regurgitation.   7. The aortic valve is tricuspid. There is moderate calcification of the  aortic valve leaflets most notably on the [place]. There is moderate  thickening of the aortic valve. No aortic stenosis.   8. Aortic valve regurgitation is mild.   9. Aortic dilatation noted. There is mild dilatation of the ascending  aorta, measuring 37 mm.  10. The inferior vena cava is normal in size with greater than 50%  respiratory variability, suggesting right atrial pressure of 3 mmHg.   Comparison(s): COmpared to prior TTE in 2016, there is no significant  change.    Recent Labs: No results found for requested labs within last 8760 hours.  Recent Lipid Panel No results found for: CHOL, TRIG, HDL, CHOLHDL, VLDL, LDLCALC, LDLDIRECT   Risk Assessment/Calculations:          Physical Exam:    VS:  BP 122/70   Pulse 70   Ht '5\' 7"'$  (1.702 m)   Wt 189 lb 3.2 oz (85.8 kg)   SpO2 98%   BMI 29.63 kg/m     Wt Readings from Last 3 Encounters:  07/15/21 189 lb 3.2 oz (85.8 kg)  11/17/20 187 lb (84.8 kg)  11/18/19 189 lb 6.4 oz (85.9 kg)     GEN:  Well nourished, well developed in no acute distress HEENT: Normal NECK: No JVD; No carotid bruits LYMPHATICS: No lymphadenopathy CARDIAC: RRR, no murmurs, rubs, gallops RESPIRATORY:  Clear to auscultation without rales, wheezing or rhonchi  ABDOMEN: Soft, non-tender, non-distended MUSCULOSKELETAL:  No edema; No deformity  SKIN: Warm and  dry NEUROLOGIC:  Alert and oriented x 3 PSYCHIATRIC:  Normal affect   ASSESSMENT:    1. Nonrheumatic aortic valve insufficiency   2. WPW (Wolff-Parkinson-White syndrome)   3. Coronary artery calcification   4. Aortic atherosclerosis ([place])   5. Essential hypertension   6. Paroxysmal SVT (supraventricular tachycardia) (HCC)    PLAN:    In order of problems listed above:  PVCs/palpitations/WPW - Post ablation Dr. Lovena Le 2012.  Continuing with flecainide 100 mg twice a day.  His prior QRS duration on ECG has been 120 ms.  Unchanged.  Brief episodes in the past year.  Continuing to monitor.  Overall since last visit doing very well.  PVCs - On flecainide.  Prior exercise treadmill test post flecanide showed no ischemic changes no VT and no significant QRS widening.  Since increasing, improved.  Mild aortic regurgitation - Echo from 20 16-20 21 shows no change, mild aortic regurgitation.  Excellent.  Essential hypertension - Previously has had a stop his amlodipine 5 mg a day and continue with telmisartan 80 mg a day.  Watching blood pressures.  Sometimes takes salt tablets prior to golf outing.  He can hold his telmisartan may be the morning of the golf outing especially if it is hot outside.  Hyperlipidemia - On atorvastatin 20 mg a day aortic atherosclerosis and coronary calcification noted in 2019 CT scan personally reviewed.  Last LDL 40.  Excellent.  Aortic atherosclerosis - Statin therapy prevention.  No changes.     Medication Adjustments/Labs and Tests Ordered: Current medicines are reviewed at length with the patient today.  Concerns regarding medicines are outlined above.  No orders of the defined types were placed in this encounter.  No orders of the defined types were placed in this encounter.   Patient Instructions  Medication Instructions:  The current medical regimen is effective;  continue present plan and medications.  *If you need a refill on your cardiac  medications before your next appointment, please call your pharmacy*  Follow-Up: At [facility], you and your health needs are our priority.  As part of our continuing mission to provide you with exceptional heart care, we have created designated Provider Care Teams.  These Care Teams include your primary Cardiologist (physician) and Advanced Practice Providers (APPs -  Physician Assistants and Nurse Practitioners) who all work together to provide you with the care you need, when you need it.  We recommend signing up for the patient portal called "MyChart".  Sign up information is provided on this After Visit Summary.  MyChart is used to connect with patients for Virtual Visits (Telemedicine).  Patients are able to view lab/test results, encounter notes, upcoming appointments, etc.  Non-urgent messages can be sent to your provider as well.   To learn more about what you can do with MyChart, go to [email or web address].    Your next appointment:   1 year(s)  The format for your next appointment:   In Person  Provider:   Candee Furbish, MD   Thank you for choosing [facility]!!     Signed, Candee Furbish, MD  07/15/2021 8:45 AM    [place]

## 2021-07-15 NOTE — Patient Instructions (Signed)
Medication Instructions:  The current medical regimen is effective;  continue present plan and medications.  *If you need a refill on your cardiac medications before your next appointment, please call your pharmacy*  Follow-Up: At [facility], you and your health needs are our priority.  As part of our continuing mission to provide you with exceptional heart care, we have created designated Provider Care Teams.  These Care Teams include your primary Cardiologist (physician) and Advanced Practice Providers (APPs -  Physician Assistants and Nurse Practitioners) who all work together to provide you with the care you need, when you need it.  We recommend signing up for the patient portal called "MyChart".  Sign up information is provided on this After Visit Summary.  MyChart is used to connect with patients for Virtual Visits (Telemedicine).  Patients are able to view lab/test results, encounter notes, upcoming appointments, etc.  Non-urgent messages can be sent to your provider as well.   To learn more about what you can do with MyChart, go to [email or web address].    Your next appointment:   1 year(s)  The format for your next appointment:   In Person  Provider:   Mark Skains, MD   Thank you for choosing [facility]!!    

## 2021-07-19 DIAGNOSIS — M5412 Radiculopathy, cervical region: Secondary | ICD-10-CM | POA: Diagnosis not present

## 2021-07-19 DIAGNOSIS — Z6829 Body mass index (BMI) 29.0-29.9, adult: Secondary | ICD-10-CM | POA: Diagnosis not present

## 2021-07-19 DIAGNOSIS — I10 Essential (primary) hypertension: Secondary | ICD-10-CM | POA: Diagnosis not present

## 2021-07-26 DIAGNOSIS — M545 Low back pain, unspecified: Secondary | ICD-10-CM | POA: Diagnosis not present

## 2021-07-26 DIAGNOSIS — M5412 Radiculopathy, cervical region: Secondary | ICD-10-CM | POA: Diagnosis not present

## 2021-07-26 DIAGNOSIS — M542 Cervicalgia: Secondary | ICD-10-CM | POA: Diagnosis not present

## 2021-08-18 DIAGNOSIS — Z6829 Body mass index (BMI) 29.0-29.9, adult: Secondary | ICD-10-CM | POA: Diagnosis not present

## 2021-08-18 DIAGNOSIS — M5412 Radiculopathy, cervical region: Secondary | ICD-10-CM | POA: Diagnosis not present

## 2021-08-18 DIAGNOSIS — M5440 Lumbago with sciatica, unspecified side: Secondary | ICD-10-CM | POA: Diagnosis not present

## 2021-08-18 DIAGNOSIS — I10 Essential (primary) hypertension: Secondary | ICD-10-CM | POA: Diagnosis not present

## 2021-08-25 DIAGNOSIS — M542 Cervicalgia: Secondary | ICD-10-CM | POA: Diagnosis not present

## 2021-08-25 DIAGNOSIS — M5440 Lumbago with sciatica, unspecified side: Secondary | ICD-10-CM | POA: Diagnosis not present

## 2021-08-25 DIAGNOSIS — M545 Low back pain, unspecified: Secondary | ICD-10-CM | POA: Diagnosis not present

## 2021-08-30 DIAGNOSIS — B351 Tinea unguium: Secondary | ICD-10-CM | POA: Diagnosis not present

## 2021-08-30 DIAGNOSIS — L570 Actinic keratosis: Secondary | ICD-10-CM | POA: Diagnosis not present

## 2021-08-30 DIAGNOSIS — C44719 Basal cell carcinoma of skin of left lower limb, including hip: Secondary | ICD-10-CM | POA: Diagnosis not present

## 2021-08-30 DIAGNOSIS — M4316 Spondylolisthesis, lumbar region: Secondary | ICD-10-CM | POA: Diagnosis not present

## 2021-08-30 DIAGNOSIS — X32XXXD Exposure to sunlight, subsequent encounter: Secondary | ICD-10-CM | POA: Diagnosis not present

## 2021-08-30 DIAGNOSIS — M5412 Radiculopathy, cervical region: Secondary | ICD-10-CM | POA: Diagnosis not present

## 2021-08-30 DIAGNOSIS — Z6829 Body mass index (BMI) 29.0-29.9, adult: Secondary | ICD-10-CM | POA: Diagnosis not present

## 2021-08-30 DIAGNOSIS — I10 Essential (primary) hypertension: Secondary | ICD-10-CM | POA: Diagnosis not present

## 2021-09-01 DIAGNOSIS — M5412 Radiculopathy, cervical region: Secondary | ICD-10-CM | POA: Diagnosis not present

## 2021-10-25 DIAGNOSIS — Z08 Encounter for follow-up examination after completed treatment for malignant neoplasm: Secondary | ICD-10-CM | POA: Diagnosis not present

## 2021-10-25 DIAGNOSIS — Z85828 Personal history of other malignant neoplasm of skin: Secondary | ICD-10-CM | POA: Diagnosis not present

## 2021-11-02 DIAGNOSIS — M4316 Spondylolisthesis, lumbar region: Secondary | ICD-10-CM | POA: Diagnosis not present

## 2021-11-02 DIAGNOSIS — M25662 Stiffness of left knee, not elsewhere classified: Secondary | ICD-10-CM | POA: Diagnosis not present

## 2021-11-02 DIAGNOSIS — Z7409 Other reduced mobility: Secondary | ICD-10-CM | POA: Diagnosis not present

## 2021-11-02 DIAGNOSIS — M25661 Stiffness of right knee, not elsewhere classified: Secondary | ICD-10-CM | POA: Diagnosis not present

## 2021-11-02 DIAGNOSIS — Z789 Other specified health status: Secondary | ICD-10-CM | POA: Diagnosis not present

## 2021-11-09 DIAGNOSIS — Z7409 Other reduced mobility: Secondary | ICD-10-CM | POA: Diagnosis not present

## 2021-11-09 DIAGNOSIS — M25661 Stiffness of right knee, not elsewhere classified: Secondary | ICD-10-CM | POA: Diagnosis not present

## 2021-11-09 DIAGNOSIS — Z789 Other specified health status: Secondary | ICD-10-CM | POA: Diagnosis not present

## 2021-11-09 DIAGNOSIS — M4316 Spondylolisthesis, lumbar region: Secondary | ICD-10-CM | POA: Diagnosis not present

## 2021-11-09 DIAGNOSIS — R202 Paresthesia of skin: Secondary | ICD-10-CM | POA: Diagnosis not present

## 2021-11-09 DIAGNOSIS — R6889 Other general symptoms and signs: Secondary | ICD-10-CM | POA: Diagnosis not present

## 2021-11-09 DIAGNOSIS — M25662 Stiffness of left knee, not elsewhere classified: Secondary | ICD-10-CM | POA: Diagnosis not present

## 2021-11-09 DIAGNOSIS — R200 Anesthesia of skin: Secondary | ICD-10-CM | POA: Diagnosis not present

## 2021-11-16 DIAGNOSIS — Z7409 Other reduced mobility: Secondary | ICD-10-CM | POA: Diagnosis not present

## 2021-11-16 DIAGNOSIS — M4316 Spondylolisthesis, lumbar region: Secondary | ICD-10-CM | POA: Diagnosis not present

## 2021-11-16 DIAGNOSIS — M25662 Stiffness of left knee, not elsewhere classified: Secondary | ICD-10-CM | POA: Diagnosis not present

## 2021-11-16 DIAGNOSIS — R202 Paresthesia of skin: Secondary | ICD-10-CM | POA: Diagnosis not present

## 2021-11-16 DIAGNOSIS — M25661 Stiffness of right knee, not elsewhere classified: Secondary | ICD-10-CM | POA: Diagnosis not present

## 2021-11-16 DIAGNOSIS — Z789 Other specified health status: Secondary | ICD-10-CM | POA: Diagnosis not present

## 2021-11-16 DIAGNOSIS — R200 Anesthesia of skin: Secondary | ICD-10-CM | POA: Diagnosis not present

## 2021-11-16 DIAGNOSIS — R6889 Other general symptoms and signs: Secondary | ICD-10-CM | POA: Diagnosis not present

## 2021-11-23 DIAGNOSIS — M4316 Spondylolisthesis, lumbar region: Secondary | ICD-10-CM | POA: Diagnosis not present

## 2021-11-23 DIAGNOSIS — Z789 Other specified health status: Secondary | ICD-10-CM | POA: Diagnosis not present

## 2021-11-23 DIAGNOSIS — M25662 Stiffness of left knee, not elsewhere classified: Secondary | ICD-10-CM | POA: Diagnosis not present

## 2021-11-23 DIAGNOSIS — R202 Paresthesia of skin: Secondary | ICD-10-CM | POA: Diagnosis not present

## 2021-11-23 DIAGNOSIS — R6889 Other general symptoms and signs: Secondary | ICD-10-CM | POA: Diagnosis not present

## 2021-11-23 DIAGNOSIS — Z7409 Other reduced mobility: Secondary | ICD-10-CM | POA: Diagnosis not present

## 2021-11-23 DIAGNOSIS — R200 Anesthesia of skin: Secondary | ICD-10-CM | POA: Diagnosis not present

## 2021-11-23 DIAGNOSIS — M25661 Stiffness of right knee, not elsewhere classified: Secondary | ICD-10-CM | POA: Diagnosis not present

## 2021-11-26 ENCOUNTER — Other Ambulatory Visit: Payer: Self-pay | Admitting: Cardiology

## 2021-11-30 DIAGNOSIS — M25661 Stiffness of right knee, not elsewhere classified: Secondary | ICD-10-CM | POA: Diagnosis not present

## 2021-11-30 DIAGNOSIS — Z7409 Other reduced mobility: Secondary | ICD-10-CM | POA: Diagnosis not present

## 2021-11-30 DIAGNOSIS — R6889 Other general symptoms and signs: Secondary | ICD-10-CM | POA: Diagnosis not present

## 2021-11-30 DIAGNOSIS — R202 Paresthesia of skin: Secondary | ICD-10-CM | POA: Diagnosis not present

## 2021-11-30 DIAGNOSIS — Z789 Other specified health status: Secondary | ICD-10-CM | POA: Diagnosis not present

## 2021-11-30 DIAGNOSIS — M4316 Spondylolisthesis, lumbar region: Secondary | ICD-10-CM | POA: Diagnosis not present

## 2021-11-30 DIAGNOSIS — M25662 Stiffness of left knee, not elsewhere classified: Secondary | ICD-10-CM | POA: Diagnosis not present

## 2021-11-30 DIAGNOSIS — R200 Anesthesia of skin: Secondary | ICD-10-CM | POA: Diagnosis not present

## 2021-12-12 ENCOUNTER — Other Ambulatory Visit: Payer: Self-pay | Admitting: Cardiology

## 2021-12-27 DIAGNOSIS — E7800 Pure hypercholesterolemia, unspecified: Secondary | ICD-10-CM | POA: Diagnosis not present

## 2021-12-27 DIAGNOSIS — I456 Pre-excitation syndrome: Secondary | ICD-10-CM | POA: Diagnosis not present

## 2021-12-27 DIAGNOSIS — Z23 Encounter for immunization: Secondary | ICD-10-CM | POA: Diagnosis not present

## 2021-12-27 DIAGNOSIS — Z0000 Encounter for general adult medical examination without abnormal findings: Secondary | ICD-10-CM | POA: Diagnosis not present

## 2021-12-27 DIAGNOSIS — G4733 Obstructive sleep apnea (adult) (pediatric): Secondary | ICD-10-CM | POA: Diagnosis not present

## 2021-12-27 DIAGNOSIS — I129 Hypertensive chronic kidney disease with stage 1 through stage 4 chronic kidney disease, or unspecified chronic kidney disease: Secondary | ICD-10-CM | POA: Diagnosis not present

## 2021-12-27 DIAGNOSIS — Z1389 Encounter for screening for other disorder: Secondary | ICD-10-CM | POA: Diagnosis not present

## 2021-12-27 DIAGNOSIS — N1831 Chronic kidney disease, stage 3a: Secondary | ICD-10-CM | POA: Diagnosis not present

## 2021-12-27 DIAGNOSIS — M5442 Lumbago with sciatica, left side: Secondary | ICD-10-CM | POA: Diagnosis not present

## 2021-12-30 DIAGNOSIS — E7800 Pure hypercholesterolemia, unspecified: Secondary | ICD-10-CM | POA: Diagnosis not present

## 2021-12-30 DIAGNOSIS — I129 Hypertensive chronic kidney disease with stage 1 through stage 4 chronic kidney disease, or unspecified chronic kidney disease: Secondary | ICD-10-CM | POA: Diagnosis not present

## 2022-01-18 ENCOUNTER — Encounter: Payer: Self-pay | Admitting: Cardiology

## 2022-02-07 DIAGNOSIS — J343 Hypertrophy of nasal turbinates: Secondary | ICD-10-CM | POA: Diagnosis not present

## 2022-02-07 DIAGNOSIS — J301 Allergic rhinitis due to pollen: Secondary | ICD-10-CM | POA: Diagnosis not present

## 2022-02-07 DIAGNOSIS — J342 Deviated nasal septum: Secondary | ICD-10-CM | POA: Diagnosis not present

## 2022-02-07 DIAGNOSIS — R0981 Nasal congestion: Secondary | ICD-10-CM | POA: Diagnosis not present

## 2022-02-07 DIAGNOSIS — J300 Vasomotor rhinitis: Secondary | ICD-10-CM | POA: Diagnosis not present

## 2022-03-28 DIAGNOSIS — J301 Allergic rhinitis due to pollen: Secondary | ICD-10-CM | POA: Diagnosis not present

## 2022-03-28 DIAGNOSIS — J342 Deviated nasal septum: Secondary | ICD-10-CM | POA: Diagnosis not present

## 2022-03-28 DIAGNOSIS — J300 Vasomotor rhinitis: Secondary | ICD-10-CM | POA: Diagnosis not present

## 2022-03-28 DIAGNOSIS — R0981 Nasal congestion: Secondary | ICD-10-CM | POA: Diagnosis not present

## 2022-04-19 DIAGNOSIS — M5412 Radiculopathy, cervical region: Secondary | ICD-10-CM | POA: Diagnosis not present

## 2022-05-03 DIAGNOSIS — M47816 Spondylosis without myelopathy or radiculopathy, lumbar region: Secondary | ICD-10-CM | POA: Diagnosis not present

## 2022-05-09 DIAGNOSIS — Z1283 Encounter for screening for malignant neoplasm of skin: Secondary | ICD-10-CM | POA: Diagnosis not present

## 2022-05-09 DIAGNOSIS — Z85828 Personal history of other malignant neoplasm of skin: Secondary | ICD-10-CM | POA: Diagnosis not present

## 2022-05-09 DIAGNOSIS — Z08 Encounter for follow-up examination after completed treatment for malignant neoplasm: Secondary | ICD-10-CM | POA: Diagnosis not present

## 2022-05-09 DIAGNOSIS — D225 Melanocytic nevi of trunk: Secondary | ICD-10-CM | POA: Diagnosis not present

## 2022-07-14 DIAGNOSIS — H2513 Age-related nuclear cataract, bilateral: Secondary | ICD-10-CM | POA: Diagnosis not present

## 2022-07-14 DIAGNOSIS — H11153 Pinguecula, bilateral: Secondary | ICD-10-CM | POA: Diagnosis not present

## 2022-07-14 DIAGNOSIS — H35033 Hypertensive retinopathy, bilateral: Secondary | ICD-10-CM | POA: Diagnosis not present

## 2022-07-14 DIAGNOSIS — H353132 Nonexudative age-related macular degeneration, bilateral, intermediate dry stage: Secondary | ICD-10-CM | POA: Diagnosis not present

## 2022-08-29 DIAGNOSIS — L738 Other specified follicular disorders: Secondary | ICD-10-CM | POA: Diagnosis not present

## 2022-08-29 DIAGNOSIS — D485 Neoplasm of uncertain behavior of skin: Secondary | ICD-10-CM | POA: Diagnosis not present

## 2022-08-31 ENCOUNTER — Other Ambulatory Visit: Payer: Self-pay | Admitting: Cardiology

## 2022-09-02 ENCOUNTER — Other Ambulatory Visit: Payer: Self-pay | Admitting: Cardiology

## 2022-09-04 ENCOUNTER — Other Ambulatory Visit: Payer: Self-pay

## 2022-09-04 MED ORDER — ATORVASTATIN CALCIUM 20 MG PO TABS: 20.0000 mg | ORAL_TABLET | Freq: Every day | ORAL | 0 refills | Status: DC

## 2022-09-23 ENCOUNTER — Other Ambulatory Visit: Payer: Self-pay | Admitting: Cardiology

## 2022-10-30 ENCOUNTER — Ambulatory Visit: Payer: Medicare HMO | Attending: Cardiology | Admitting: Cardiology

## 2022-10-30 ENCOUNTER — Encounter: Payer: Self-pay | Admitting: Cardiology

## 2022-10-30 VITALS — BP 140/70 | HR 74 | Ht 67.0 in | Wt 190.0 lb

## 2022-10-30 DIAGNOSIS — I700 Atherosclerosis of aorta: Secondary | ICD-10-CM

## 2022-10-30 DIAGNOSIS — I2584 Coronary atherosclerosis due to calcified coronary lesion: Secondary | ICD-10-CM | POA: Diagnosis not present

## 2022-10-30 DIAGNOSIS — I2510 Atherosclerotic heart disease of native coronary artery without angina pectoris: Secondary | ICD-10-CM

## 2022-10-30 DIAGNOSIS — I10 Essential (primary) hypertension: Secondary | ICD-10-CM | POA: Diagnosis not present

## 2022-10-30 MED ORDER — ATORVASTATIN CALCIUM 20 MG PO TABS: 20.0000 mg | ORAL_TABLET | Freq: Every day | ORAL | 3 refills | Status: DC

## 2022-10-30 MED ORDER — TELMISARTAN 80 MG PO TABS: 80.0000 mg | ORAL_TABLET | Freq: Every day | ORAL | 3 refills | Status: DC

## 2022-10-30 MED ORDER — FLECAINIDE ACETATE 100 MG PO TABS: 100.0000 mg | ORAL_TABLET | Freq: Two times a day (BID) | ORAL | 3 refills | Status: DC

## 2022-10-30 NOTE — Patient Instructions (Signed)
Medication Instructions:  The current medical regimen is effective;  continue present plan and medications.  *If you need a refill on your cardiac medications before your next appointment, please call your pharmacy*  Follow-Up: At [facility], you and your health needs are our priority.  As part of our continuing mission to provide you with exceptional heart care, we have created designated Provider Care Teams.  These Care Teams include your primary Cardiologist (physician) and Advanced Practice Providers (APPs -  Physician Assistants and Nurse Practitioners) who all work together to provide you with the care you need, when you need it.  We recommend signing up for the patient portal called "MyChart".  Sign up information is provided on this After Visit Summary.  MyChart is used to connect with patients for Virtual Visits (Telemedicine).  Patients are able to view lab/test results, encounter notes, upcoming appointments, etc.  Non-urgent messages can be sent to your provider as well.   To learn more about what you can do with MyChart, go to [email or web address].    Your next appointment:   1 year(s)  The format for your next appointment:   In Person  Provider:   Mark Skains, MD      Important Information About Sugar       

## 2022-10-30 NOTE — Progress Notes (Signed)
Cardiology Office Note:    Date:  10/30/2022   ID:  [name], DOB 08/27/1946, MRN [id number]  PCP:  Lavone Orn, MD   [facility] Providers Cardiologist:  Candee Furbish, MD     Referring MD: Lavone Orn, MD    History of Present Illness:    [name] is a 76 y.o. male here for follow-up of hypertension and hyperlipidemia.   He was last seen on 07/15/2021 for follow-up on hypertension as well as WPW post ablation by Dr. Lovena Le in 2012 on flecainide 100 mg twice a day. He had salt tables that he was taking prior to golfing. This helped prevent dizziness or getting dehydrated. I stated that if he wanted to hold his Telmisartan the morning of a golf outing that would be fine. He was advised to monitor his blood pressures.  Since his increased dosage of Flecanide, his palpitations improved. He was advised to continue medical regimens, no changes.  Today, he has been doing well. He denied experiencing any palpitations. Occasionally, he would see his heart rate elevating but not serious.  He has been compliant and doing well on the Flecainide. He has also been consistent with hydration.  He denies any palpitations, chest pain, shortness of breath, or peripheral edema. No lightheadedness, headaches, syncope, orthopnea, or PND.  Past Medical History:  Diagnosis Date   Aortic regurgitation    moderate   Arthritis    Chronic back pain    Hyperlipidemia    Hypertension    Seasonal allergies    Wolff-Parkinson-White syndrome     Past Surgical History:  Procedure Laterality Date   APPENDECTOMY     CERVICAL SPINE SURGERY     CHOLECYSTECTOMY     KNEE SURGERY      Current Medications: Current Meds  Medication Sig   acetaminophen (TYLENOL) 500 MG tablet Take 1,000 mg by mouth every 6 (six) hours as needed.     aspirin 81 MG tablet Take 81 mg by mouth daily.    fluticasone (FLONASE) 50 MCG/ACT nasal spray Place 2 sprays into both nostrils daily.   multivitamin  (THERAGRAN) per tablet Take 1 tablet by mouth daily.     NON FORMULARY Max vision eye vitamin. 1 tablet daily    [DISCONTINUED] atorvastatin (LIPITOR) 20 MG tablet Take 1 tablet (20 mg total) by mouth daily. Pt must keep upcoming appt in November 2023 with Dr. Marlou Porch before anymore refills. Thank you Final Attempt   [DISCONTINUED] flecainide (TAMBOCOR) 100 MG tablet Take 1 tablet (100 mg total) by mouth 2 (two) times daily.   [DISCONTINUED] telmisartan (MICARDIS) 80 MG tablet Take 80 mg by mouth daily.     Allergies:   Lisinopril   Social History   Socioeconomic History   Marital status: Married    Spouse name: Not on file   Number of children: 2   Years of education: Not on file   Highest education level: Not on file  Occupational History   Not on file  Tobacco Use   Smoking status: Former    Types: Cigarettes    Quit date: 12/25/2002    Years since quitting: 19.8   Smokeless tobacco: Never  Vaping Use   Vaping Use: Never used  Substance and Sexual Activity   Alcohol use: No   Drug use: No   Sexual activity: Not on file  Other Topics Concern   Not on file  Social History Narrative   Not on file   Social Determinants of  Health   Financial Resource Strain: Not on file  Food Insecurity: Not on file  Transportation Needs: Not on file  Physical Activity: Not on file  Stress: Not on file  Social Connections: Not on file     Family History: The patient's family history includes Other in his mother; Stroke (age of onset: 63) in his father.  ROS:   Please see the history of present illness.     All other systems reviewed and are negative.  EKGs/Labs/Other Studies Reviewed:    The following studies were reviewed today:  Echo from 12/15/2020:   1. Left ventricular ejection fraction, by estimation, is 55 to 60%. The  left ventricle has normal function.   2. The left ventricle has no regional wall motion abnormalities.   3. There is mild left ventricular hypertrophy  of the basal-septal  segment.   4. Left ventricular diastolic parameters are consistent with Grade I  diastolic dysfunction (impaired relaxation).   5. Right ventricular systolic function is normal. The right ventricular  size is normal.   6. The mitral valve is normal in structure. Trivial mitral valve  regurgitation.   7. The aortic valve is tricuspid. There is moderate calcification of the  aortic valve leaflets most notably on the [place]. There is moderate  thickening of the aortic valve. No aortic stenosis.   8. Aortic valve regurgitation is mild.   9. Aortic dilatation noted. There is mild dilatation of the ascending  aorta, measuring 37 mm.  10. The inferior vena cava is normal in size with greater than 50%  respiratory variability, suggesting right atrial pressure of 3 mmHg.   Comparison(s): COmpared to prior TTE in 2016, there is no significant  change.   CT Chest 05/14/2018: FINDINGS: Cardiovascular: There is no thoracic aortic aneurysm. Visualized great vessels appear unremarkable. There are foci of atherosclerotic calcification aorta. There are foci coronary artery calcification. There is no pericardial effusion or pericardial thickening.   MPRESSION: 1. Multiple pulmonary nodular lesions are either stable or slightly smaller. No new pulmonary nodular lesion. There is underlying centrilobular emphysematous change. No edema or consolidation.   2.  No evident adenopathy.   3. Aortic atherosclerosis. Foci of coronary artery calcification noted.   Aortic Atherosclerosis (ICD10-I70.0) and Emphysema (ICD10-J43.9).  Echo from 2016  EF of 50 to 55% with mild LVH and grade 1 diastolic dysfunction with mild aortic valve regurgitation.  EKG: EKG is personally reviewed. 10/30/2022: NSR., Rate 74 bpm.  Recent Labs: No results found for requested labs within last 365 days.  Recent Lipid Panel No results found for: "CHOL", "TRIG", "HDL", "CHOLHDL", "VLDL", "LDLCALC",  "LDLDIRECT"   Risk Assessment/Calculations:          Physical Exam:    VS:  BP (!) 140/70 (BP Location: Left Arm, Patient Position: Sitting, Cuff Size: Normal)   Pulse 74   Ht '5\' 7"'$  (1.702 m)   Wt 190 lb (86.2 kg)   BMI 29.76 kg/m     Wt Readings from Last 3 Encounters:  10/30/22 190 lb (86.2 kg)  07/15/21 189 lb 3.2 oz (85.8 kg)  11/17/20 187 lb (84.8 kg)     GEN:  Well nourished, well developed in no acute distress HEENT: Normal NECK: No JVD; No carotid bruits LYMPHATICS: No lymphadenopathy CARDIAC: RRR, 2/6 systolic murmur, no rubs or gallops RESPIRATORY:  Clear to auscultation without rales, wheezing or rhonchi  ABDOMEN: Soft, non-tender, non-distended MUSCULOSKELETAL:  No edema; No deformity  SKIN: Warm and dry NEUROLOGIC:  Alert and oriented x 3 PSYCHIATRIC:  Normal affect   ASSESSMENT:    1. Essential hypertension   2. Aortic atherosclerosis ([place])   3. Coronary artery calcification     PLAN:    In order of problems listed above:  PVCs/palpitations/WPW - Post ablation Dr. Lovena Le 2012.  Continuing with flecainide 100 mg twice a day.  His prior QRS duration on ECG has been 120 ms.  Unchanged.  Brief episodes in the past year.  Continuing to monitor.  Overall since last visit doing very well.  PVCs - On flecainide.  Prior exercise treadmill test post flecanide showed no ischemic changes no VT and no significant QRS widening.  Since increasing, improved.  EKG once again reassuring.  No high risk symptoms such as syncope.  Mild aortic regurgitation - Echo from 2016-20 21 shows no change, mild aortic regurgitation.  Excellent.  Reviewed as above.  Murmur heard on exam  Essential hypertension - Previously has had a stop his amlodipine 5 mg a day and continue with telmisartan 80 mg a day.  Watching blood pressures.  Sometimes takes salt tablets prior to golf outing.  He can hold his telmisartan may be the morning of the golf outing especially if it is hot  outside.  Overall feels well.  Hyperlipidemia - On atorvastatin 20 mg a day aortic atherosclerosis and coronary calcification noted in 2019 CT scan personally reviewed.  Last LDL 44.  Excellent.  Lower the better.  Aortic atherosclerosis - Statin therapy prevention.  No changes.  Doing well.  Follow-up: 1-year.  Medication Adjustments/Labs and Tests Ordered: Current medicines are reviewed at length with the patient today.  Concerns regarding medicines are outlined above.  Orders Placed This Encounter  Procedures   EKG 12-Lead    Meds ordered this encounter  Medications   telmisartan (MICARDIS) 80 MG tablet    Sig: Take 1 tablet (80 mg total) by mouth daily.    Dispense:  90 tablet    Refill:  3   flecainide (TAMBOCOR) 100 MG tablet    Sig: Take 1 tablet (100 mg total) by mouth 2 (two) times daily.    Dispense:  180 tablet    Refill:  3   atorvastatin (LIPITOR) 20 MG tablet    Sig: Take 1 tablet (20 mg total) by mouth daily.    Dispense:  90 tablet    Refill:  3    Patient Instructions  Medication Instructions:  The current medical regimen is effective;  continue present plan and medications.  *If you need a refill on your cardiac medications before your next appointment, please call your pharmacy*  Follow-Up: At [facility], you and your health needs are our priority.  As part of our continuing mission to provide you with exceptional heart care, we have created designated Provider Care Teams.  These Care Teams include your primary Cardiologist (physician) and Advanced Practice Providers (APPs -  Physician Assistants and Nurse Practitioners) who all work together to provide you with the care you need, when you need it.  We recommend signing up for the patient portal called "MyChart".  Sign up information is provided on this After Visit Summary.  MyChart is used to connect with patients for Virtual Visits (Telemedicine).  Patients are able to view lab/test results,  encounter notes, upcoming appointments, etc.  Non-urgent messages can be sent to your provider as well.   To learn more about what you can do with MyChart, go to [email or web address].  Your next appointment:   1 year(s)  The format for your next appointment:   In Person  Provider:   Candee Furbish, MD      Important Information About Sugar          Ericka Pontiff as a scribe for Candee Furbish, MD.,have documented all relevant documentation on the behalf of Candee Furbish, MD,as directed by  Candee Furbish, MD while in the presence of Candee Furbish, MD.  I, Candee Furbish, MD, have reviewed all documentation for this visit. The documentation on 10/30/22 for the exam, diagnosis, procedures, and orders are all accurate and complete.   Signed, Candee Furbish, MD  10/30/2022 4:48 PM    [place]

## 2022-11-10 DIAGNOSIS — M47816 Spondylosis without myelopathy or radiculopathy, lumbar region: Secondary | ICD-10-CM | POA: Diagnosis not present

## 2022-11-10 DIAGNOSIS — M545 Low back pain, unspecified: Secondary | ICD-10-CM | POA: Diagnosis not present

## 2022-11-22 DIAGNOSIS — R202 Paresthesia of skin: Secondary | ICD-10-CM | POA: Diagnosis not present

## 2022-11-22 DIAGNOSIS — M545 Low back pain, unspecified: Secondary | ICD-10-CM | POA: Diagnosis not present

## 2022-11-22 DIAGNOSIS — M47816 Spondylosis without myelopathy or radiculopathy, lumbar region: Secondary | ICD-10-CM | POA: Diagnosis not present

## 2023-01-23 DIAGNOSIS — I129 Hypertensive chronic kidney disease with stage 1 through stage 4 chronic kidney disease, or unspecified chronic kidney disease: Secondary | ICD-10-CM | POA: Diagnosis not present

## 2023-01-23 DIAGNOSIS — I700 Atherosclerosis of aorta: Secondary | ICD-10-CM | POA: Diagnosis not present

## 2023-01-23 DIAGNOSIS — I471 Supraventricular tachycardia, unspecified: Secondary | ICD-10-CM | POA: Diagnosis not present

## 2023-01-23 DIAGNOSIS — R42 Dizziness and giddiness: Secondary | ICD-10-CM | POA: Diagnosis not present

## 2023-01-23 DIAGNOSIS — N1831 Chronic kidney disease, stage 3a: Secondary | ICD-10-CM | POA: Diagnosis not present

## 2023-01-23 DIAGNOSIS — E7800 Pure hypercholesterolemia, unspecified: Secondary | ICD-10-CM | POA: Diagnosis not present

## 2023-02-21 DIAGNOSIS — Z6830 Body mass index (BMI) 30.0-30.9, adult: Secondary | ICD-10-CM | POA: Diagnosis not present

## 2023-02-21 DIAGNOSIS — I10 Essential (primary) hypertension: Secondary | ICD-10-CM | POA: Diagnosis not present

## 2023-02-28 DIAGNOSIS — M47816 Spondylosis without myelopathy or radiculopathy, lumbar region: Secondary | ICD-10-CM | POA: Diagnosis not present

## 2023-03-28 DIAGNOSIS — R200 Anesthesia of skin: Secondary | ICD-10-CM | POA: Diagnosis not present

## 2023-03-28 DIAGNOSIS — M461 Sacroiliitis, not elsewhere classified: Secondary | ICD-10-CM | POA: Diagnosis not present

## 2023-03-28 DIAGNOSIS — M47816 Spondylosis without myelopathy or radiculopathy, lumbar region: Secondary | ICD-10-CM | POA: Diagnosis not present

## 2023-03-28 DIAGNOSIS — G894 Chronic pain syndrome: Secondary | ICD-10-CM | POA: Diagnosis not present

## 2023-04-11 DIAGNOSIS — M461 Sacroiliitis, not elsewhere classified: Secondary | ICD-10-CM | POA: Diagnosis not present

## 2023-05-02 DIAGNOSIS — G894 Chronic pain syndrome: Secondary | ICD-10-CM | POA: Diagnosis not present

## 2023-05-02 DIAGNOSIS — M47816 Spondylosis without myelopathy or radiculopathy, lumbar region: Secondary | ICD-10-CM | POA: Diagnosis not present

## 2023-05-02 DIAGNOSIS — M461 Sacroiliitis, not elsewhere classified: Secondary | ICD-10-CM | POA: Diagnosis not present

## 2023-05-30 DIAGNOSIS — G894 Chronic pain syndrome: Secondary | ICD-10-CM | POA: Diagnosis not present

## 2023-05-30 DIAGNOSIS — M461 Sacroiliitis, not elsewhere classified: Secondary | ICD-10-CM | POA: Diagnosis not present

## 2023-06-18 DIAGNOSIS — R200 Anesthesia of skin: Secondary | ICD-10-CM | POA: Diagnosis not present

## 2023-06-18 DIAGNOSIS — M461 Sacroiliitis, not elsewhere classified: Secondary | ICD-10-CM | POA: Diagnosis not present

## 2023-07-19 DIAGNOSIS — H2513 Age-related nuclear cataract, bilateral: Secondary | ICD-10-CM | POA: Diagnosis not present

## 2023-07-19 DIAGNOSIS — H35033 Hypertensive retinopathy, bilateral: Secondary | ICD-10-CM | POA: Diagnosis not present

## 2023-07-19 DIAGNOSIS — H11153 Pinguecula, bilateral: Secondary | ICD-10-CM | POA: Diagnosis not present

## 2023-07-19 DIAGNOSIS — H5212 Myopia, left eye: Secondary | ICD-10-CM | POA: Diagnosis not present

## 2023-07-19 DIAGNOSIS — H52223 Regular astigmatism, bilateral: Secondary | ICD-10-CM | POA: Diagnosis not present

## 2023-07-19 DIAGNOSIS — H524 Presbyopia: Secondary | ICD-10-CM | POA: Diagnosis not present

## 2023-07-19 DIAGNOSIS — H353132 Nonexudative age-related macular degeneration, bilateral, intermediate dry stage: Secondary | ICD-10-CM | POA: Diagnosis not present

## 2023-07-24 DIAGNOSIS — Z23 Encounter for immunization: Secondary | ICD-10-CM | POA: Diagnosis not present

## 2023-07-24 DIAGNOSIS — E7800 Pure hypercholesterolemia, unspecified: Secondary | ICD-10-CM | POA: Diagnosis not present

## 2023-07-24 DIAGNOSIS — Z0000 Encounter for general adult medical examination without abnormal findings: Secondary | ICD-10-CM | POA: Diagnosis not present

## 2023-07-24 DIAGNOSIS — I129 Hypertensive chronic kidney disease with stage 1 through stage 4 chronic kidney disease, or unspecified chronic kidney disease: Secondary | ICD-10-CM | POA: Diagnosis not present

## 2023-07-24 DIAGNOSIS — E538 Deficiency of other specified B group vitamins: Secondary | ICD-10-CM | POA: Diagnosis not present

## 2023-07-24 DIAGNOSIS — R42 Dizziness and giddiness: Secondary | ICD-10-CM | POA: Diagnosis not present

## 2023-08-08 DIAGNOSIS — M4722 Other spondylosis with radiculopathy, cervical region: Secondary | ICD-10-CM | POA: Diagnosis not present

## 2023-08-08 DIAGNOSIS — M1612 Unilateral primary osteoarthritis, left hip: Secondary | ICD-10-CM | POA: Diagnosis not present

## 2023-08-08 DIAGNOSIS — M160 Bilateral primary osteoarthritis of hip: Secondary | ICD-10-CM | POA: Diagnosis not present

## 2023-08-16 DIAGNOSIS — M1612 Unilateral primary osteoarthritis, left hip: Secondary | ICD-10-CM | POA: Diagnosis not present

## 2023-09-03 ENCOUNTER — Encounter: Payer: Self-pay | Admitting: Cardiology

## 2023-10-16 ENCOUNTER — Other Ambulatory Visit: Payer: Self-pay | Admitting: Cardiology

## 2023-10-25 ENCOUNTER — Other Ambulatory Visit: Payer: Self-pay | Admitting: Cardiology

## 2023-11-21 ENCOUNTER — Other Ambulatory Visit: Payer: Self-pay | Admitting: Cardiology

## 2023-12-05 DIAGNOSIS — M47816 Spondylosis without myelopathy or radiculopathy, lumbar region: Secondary | ICD-10-CM | POA: Diagnosis not present

## 2023-12-05 DIAGNOSIS — G894 Chronic pain syndrome: Secondary | ICD-10-CM | POA: Diagnosis not present

## 2023-12-11 DIAGNOSIS — M47816 Spondylosis without myelopathy or radiculopathy, lumbar region: Secondary | ICD-10-CM | POA: Diagnosis not present

## 2023-12-27 DIAGNOSIS — M5126 Other intervertebral disc displacement, lumbar region: Secondary | ICD-10-CM | POA: Diagnosis not present

## 2023-12-27 DIAGNOSIS — M5127 Other intervertebral disc displacement, lumbosacral region: Secondary | ICD-10-CM | POA: Diagnosis not present

## 2023-12-27 DIAGNOSIS — M461 Sacroiliitis, not elsewhere classified: Secondary | ICD-10-CM | POA: Diagnosis not present

## 2023-12-27 DIAGNOSIS — R200 Anesthesia of skin: Secondary | ICD-10-CM | POA: Diagnosis not present

## 2023-12-27 DIAGNOSIS — M5137 Other intervertebral disc degeneration, lumbosacral region with discogenic back pain only: Secondary | ICD-10-CM | POA: Diagnosis not present

## 2023-12-27 DIAGNOSIS — M47816 Spondylosis without myelopathy or radiculopathy, lumbar region: Secondary | ICD-10-CM | POA: Diagnosis not present

## 2023-12-27 DIAGNOSIS — M5136 Other intervertebral disc degeneration, lumbar region with discogenic back pain only: Secondary | ICD-10-CM | POA: Diagnosis not present

## 2024-01-02 DIAGNOSIS — M47816 Spondylosis without myelopathy or radiculopathy, lumbar region: Secondary | ICD-10-CM | POA: Diagnosis not present

## 2024-01-03 ENCOUNTER — Encounter: Payer: Self-pay | Admitting: Cardiology

## 2024-01-03 ENCOUNTER — Ambulatory Visit: Payer: Medicare HMO | Attending: Cardiology | Admitting: Cardiology

## 2024-01-03 VITALS — BP 132/78 | HR 73 | Resp 16 | Ht 67.0 in | Wt 188.0 lb

## 2024-01-03 DIAGNOSIS — I2510 Atherosclerotic heart disease of native coronary artery without angina pectoris: Secondary | ICD-10-CM | POA: Diagnosis not present

## 2024-01-03 DIAGNOSIS — I493 Ventricular premature depolarization: Secondary | ICD-10-CM | POA: Diagnosis not present

## 2024-01-03 DIAGNOSIS — I700 Atherosclerosis of aorta: Secondary | ICD-10-CM

## 2024-01-03 DIAGNOSIS — I10 Essential (primary) hypertension: Secondary | ICD-10-CM | POA: Diagnosis not present

## 2024-01-03 DIAGNOSIS — I351 Nonrheumatic aortic (valve) insufficiency: Secondary | ICD-10-CM

## 2024-01-03 MED ORDER — ATORVASTATIN CALCIUM 20 MG PO TABS: 20.0000 mg | ORAL_TABLET | Freq: Every day | ORAL | 3 refills | Status: AC

## 2024-01-03 MED ORDER — TELMISARTAN 80 MG PO TABS: 80.0000 mg | ORAL_TABLET | Freq: Every day | ORAL | 3 refills | Status: DC

## 2024-01-03 MED ORDER — FLECAINIDE ACETATE 100 MG PO TABS: 100.0000 mg | ORAL_TABLET | Freq: Two times a day (BID) | ORAL | 3 refills | Status: DC

## 2024-01-03 NOTE — Progress Notes (Signed)
 Cardiology Office Note:  .   Date:  01/03/2024  ID:  [name], DOB Dec 15, 1946, MRN [id number] PCP: Signa Rush, MD (Inactive)  [facility] Providers Cardiologist:  Oneil Parchment, MD     History of Present Illness: .   [name] is a 78 y.o. male Discussed with the use of AI scribe   History of Present Illness   The patient, a 78 year old with a history of hypertension, hyperlipidemia, and Wolff-Parkinson-White syndrome post-ablation in 2012, presents for a routine follow-up. He reports significant improvement in palpitations since the dose of flecainide  was increased. He denies any chest pain or shortness of breath. An echocardiogram performed in 2021 showed an EF of 60%, grade one diastolic dysfunction, moderate calcification of the aortic valve, most notably in the left coronary cusp, but no aortic stenosis, and mild aortic regurgitation. The ascending aorta was measured at 37mm, borderline. A CT of the chest in 2019 showed aortic atherosclerosis. The patient's PVCs are well controlled with flecainide .  The patient has been monitoring his heart rate, which ranges from the mid-40s to 120s depending on activity. He denies any symptoms of fainting or shortness of breath during these episodes. He recently underwent an ablation procedure for his back, which he reports has improved his mobility and allowed him to travel. He denies any new symptoms or concerns.       Traveled to [place], [place], [place]   ROS: No CP, no SOB, no syncope  Studies Reviewed: SABRA   EKG Interpretation Date/Time:  Thursday January 03 2024 09:21:58 EST Ventricular Rate:  75 PR Interval:  202 QRS Duration:  114 QT Interval:  418 QTC Calculation: 466 R Axis:   -24  Text Interpretation: Normal sinus rhythm Normal ECG No significant change since last tracing Confirmed by Parchment Oneil ([id number]) on 01/03/2024 9:44:14 AM    Results   RADIOLOGY CT of chest: Aortic atherosclerosis  (2019)  DIAGNOSTIC Echocardiogram: EF 60%, grade 1 diastolic dysfunction, moderate calcification of the aortic valve, no aortic stenosis, mild aortic regurgitation, ascending aorta 37 mm (12/15/2020) EKG: Normal, no PVCs     Risk Assessment/Calculations:            Physical Exam:   VS:  BP 132/78 (BP Location: Left Arm, Patient Position: Sitting, Cuff Size: Large)   Pulse 73   Resp 16   Ht 5' 7 (1.702 m)   Wt 188 lb (85.3 kg)   SpO2 97%   BMI 29.44 kg/m    Wt Readings from Last 3 Encounters:  01/03/24 188 lb (85.3 kg)  10/30/22 190 lb (86.2 kg)  07/15/21 189 lb 3.2 oz (85.8 kg)    GEN: Well nourished, well developed in no acute distress NECK: No JVD; No carotid bruits CARDIAC: RRR, 2/6 systolic murmur, no rubs, no gallops RESPIRATORY:  Clear to auscultation without rales, wheezing or rhonchi  ABDOMEN: Soft, non-tender, non-distended EXTREMITIES:  No edema; No deformity   ASSESSMENT AND PLAN: .    Assessment and Plan    Wolff-Parkinson-White (WPW) Syndrome post-ablation PVC's WPW syndrome post-ablation in 2012, PVC's managed with flecainide  100 mg twice daily and aspirin 81 mg daily. Palpitations improved with increased flecainide  dose. EKG shows no PVCs, indicating good control. Discussed flecainide 's potential to slow heart rate and the importance of monitoring heart rate with a watch. - Continue flecainide  100 mg twice daily - Continue aspirin 81 mg daily - Monitor heart rate and rhythm regularly  Aortic Valve Calcification with Mild Aortic Regurgitation Echocardiogram  from 12/15/2020 showed EF of 60%, grade 1 diastolic dysfunction, moderate aortic valve calcification, and mild aortic regurgitation. No aortic stenosis. Ascending aorta measured at 37 mm. Repeat echocardiogram warranted to assess progression. Explained noninvasive nature of echo and its role in monitoring changes. - Order echocardiogram - Review results and adjust management as needed  Aortic  Atherosclerosis Aortic atherosclerosis noted on 2019 chest CT. No new symptoms or changes reported. - Continue current management - Monitor for new symptoms  Hypertension Blood pressure well-controlled with telmisartan  80 mg daily. Previous readings higher during stress. No adjustments needed. - Continue telmisartan  80 mg daily - Monitor blood pressure regularly - Follow up in one year  Hyperlipidemia Lipid levels managed with atorvastatin  20 mg daily. No changes necessary. - Continue atorvastatin  20 mg daily - Monitor lipid levels annually  General Health Maintenance  - Follow up in one year  Follow-up - Follow up in one year - Review echocardiogram results when available.               Signed, Oneil Parchment, MD

## 2024-01-03 NOTE — Patient Instructions (Signed)
 Medication Instructions:  The current medical regimen is effective;  continue present plan and medications.  *If you need a refill on your cardiac medications before your next appointment, please call your pharmacy*  Testing/Procedures: Your physician has requested that you have an echocardiogram. Echocardiography is a painless test that uses sound waves to create images of your heart. It provides your doctor with information about the size and shape of your heart and how well your heart's chambers and valves are working. This procedure takes approximately one hour. There are no restrictions for this procedure. Please do NOT wear cologne, perfume, aftershave, or lotions (deodorant is allowed). Please arrive 15 minutes prior to your appointment time.  Please note: We ask at that you not bring children with you during ultrasound (echo/ vascular) testing. Due to room size and safety concerns, children are not allowed in the ultrasound rooms during exams. Our front office staff cannot provide observation of children in our lobby area while testing is being conducted. An adult accompanying a patient to their appointment will only be allowed in the ultrasound room at the discretion of the ultrasound technician under special circumstances. We apologize for any inconvenience.   Follow-Up: At [facility], you and your health needs are our priority.  As part of our continuing mission to provide you with exceptional heart care, we have created designated Provider Care Teams.  These Care Teams include your primary Cardiologist (physician) and Advanced Practice Providers (APPs -  Physician Assistants and Nurse Practitioners) who all work together to provide you with the care you need, when you need it.  We recommend signing up for the patient portal called "MyChart".  Sign up information is provided on this After Visit Summary.  MyChart is used to connect with patients for Virtual Visits (Telemedicine).   Patients are able to view lab/test results, encounter notes, upcoming appointments, etc.  Non-urgent messages can be sent to your provider as well.   To learn more about what you can do with MyChart, go to [email or web address].    Your next appointment:   1 year(s)  Provider:   Donato Schultz, MD

## 2024-01-18 ENCOUNTER — Ambulatory Visit (HOSPITAL_COMMUNITY): Payer: Medicare HMO | Attending: Internal Medicine

## 2024-01-18 DIAGNOSIS — I351 Nonrheumatic aortic (valve) insufficiency: Secondary | ICD-10-CM | POA: Insufficient documentation

## 2024-01-18 LAB — ECHOCARDIOGRAM COMPLETE
AR max vel: 2.18 cm2
AV Area VTI: 2.35 cm2
AV Area mean vel: 2.32 cm2
AV Mean grad: 12 mm[Hg]
AV Peak grad: 23.2 mm[Hg]
Ao pk vel: 2.41 m/s
Area-P 1/2: 2.88 cm2
P 1/2 time: 503 ms
S' Lateral: 3.1 cm

## 2024-01-23 ENCOUNTER — Encounter: Payer: Self-pay | Admitting: Cardiology

## 2024-01-25 ENCOUNTER — Other Ambulatory Visit: Payer: Self-pay | Admitting: *Deleted

## 2024-01-25 DIAGNOSIS — I351 Nonrheumatic aortic (valve) insufficiency: Secondary | ICD-10-CM

## 2024-01-25 DIAGNOSIS — E7800 Pure hypercholesterolemia, unspecified: Secondary | ICD-10-CM | POA: Diagnosis not present

## 2024-01-25 DIAGNOSIS — I10 Essential (primary) hypertension: Secondary | ICD-10-CM | POA: Diagnosis not present

## 2024-02-07 DIAGNOSIS — M5417 Radiculopathy, lumbosacral region: Secondary | ICD-10-CM | POA: Diagnosis not present

## 2024-02-07 DIAGNOSIS — E538 Deficiency of other specified B group vitamins: Secondary | ICD-10-CM | POA: Diagnosis not present

## 2024-02-07 DIAGNOSIS — G5603 Carpal tunnel syndrome, bilateral upper limbs: Secondary | ICD-10-CM | POA: Diagnosis not present

## 2024-02-07 DIAGNOSIS — R634 Abnormal weight loss: Secondary | ICD-10-CM | POA: Diagnosis not present

## 2024-02-07 DIAGNOSIS — G609 Hereditary and idiopathic neuropathy, unspecified: Secondary | ICD-10-CM | POA: Diagnosis not present

## 2024-02-07 DIAGNOSIS — E531 Pyridoxine deficiency: Secondary | ICD-10-CM | POA: Diagnosis not present

## 2024-02-07 DIAGNOSIS — R202 Paresthesia of skin: Secondary | ICD-10-CM | POA: Diagnosis not present

## 2024-02-07 DIAGNOSIS — G603 Idiopathic progressive neuropathy: Secondary | ICD-10-CM | POA: Diagnosis not present

## 2024-02-07 DIAGNOSIS — E559 Vitamin D deficiency, unspecified: Secondary | ICD-10-CM | POA: Diagnosis not present

## 2024-03-13 DIAGNOSIS — M7918 Myalgia, other site: Secondary | ICD-10-CM | POA: Diagnosis not present

## 2024-03-13 DIAGNOSIS — G5603 Carpal tunnel syndrome, bilateral upper limbs: Secondary | ICD-10-CM | POA: Diagnosis not present

## 2024-03-13 DIAGNOSIS — M5417 Radiculopathy, lumbosacral region: Secondary | ICD-10-CM | POA: Diagnosis not present

## 2024-03-13 DIAGNOSIS — G603 Idiopathic progressive neuropathy: Secondary | ICD-10-CM | POA: Diagnosis not present

## 2024-04-15 DIAGNOSIS — M7061 Trochanteric bursitis, right hip: Secondary | ICD-10-CM | POA: Diagnosis not present

## 2024-05-08 DIAGNOSIS — M7918 Myalgia, other site: Secondary | ICD-10-CM | POA: Diagnosis not present

## 2024-05-08 DIAGNOSIS — G5603 Carpal tunnel syndrome, bilateral upper limbs: Secondary | ICD-10-CM | POA: Diagnosis not present

## 2024-05-08 DIAGNOSIS — M5417 Radiculopathy, lumbosacral region: Secondary | ICD-10-CM | POA: Diagnosis not present

## 2024-05-08 DIAGNOSIS — G603 Idiopathic progressive neuropathy: Secondary | ICD-10-CM | POA: Diagnosis not present

## 2024-05-13 DIAGNOSIS — X32XXXD Exposure to sunlight, subsequent encounter: Secondary | ICD-10-CM | POA: Diagnosis not present

## 2024-05-13 DIAGNOSIS — L570 Actinic keratosis: Secondary | ICD-10-CM | POA: Diagnosis not present

## 2024-05-13 DIAGNOSIS — C44319 Basal cell carcinoma of skin of other parts of face: Secondary | ICD-10-CM | POA: Diagnosis not present

## 2024-05-30 DIAGNOSIS — S39012A Strain of muscle, fascia and tendon of lower back, initial encounter: Secondary | ICD-10-CM | POA: Diagnosis not present

## 2024-06-03 DIAGNOSIS — M5417 Radiculopathy, lumbosacral region: Secondary | ICD-10-CM | POA: Diagnosis not present

## 2024-06-24 DIAGNOSIS — Z85828 Personal history of other malignant neoplasm of skin: Secondary | ICD-10-CM | POA: Diagnosis not present

## 2024-06-24 DIAGNOSIS — Z08 Encounter for follow-up examination after completed treatment for malignant neoplasm: Secondary | ICD-10-CM | POA: Diagnosis not present

## 2024-07-21 DIAGNOSIS — H11153 Pinguecula, bilateral: Secondary | ICD-10-CM | POA: Diagnosis not present

## 2024-07-21 DIAGNOSIS — H35033 Hypertensive retinopathy, bilateral: Secondary | ICD-10-CM | POA: Diagnosis not present

## 2024-07-21 DIAGNOSIS — H2513 Age-related nuclear cataract, bilateral: Secondary | ICD-10-CM | POA: Diagnosis not present

## 2024-07-21 DIAGNOSIS — H353132 Nonexudative age-related macular degeneration, bilateral, intermediate dry stage: Secondary | ICD-10-CM | POA: Diagnosis not present

## 2024-07-24 DIAGNOSIS — E7800 Pure hypercholesterolemia, unspecified: Secondary | ICD-10-CM | POA: Diagnosis not present

## 2024-07-24 DIAGNOSIS — I10 Essential (primary) hypertension: Secondary | ICD-10-CM | POA: Diagnosis not present

## 2024-08-07 DIAGNOSIS — M5417 Radiculopathy, lumbosacral region: Secondary | ICD-10-CM | POA: Diagnosis not present

## 2024-08-07 DIAGNOSIS — M5412 Radiculopathy, cervical region: Secondary | ICD-10-CM | POA: Diagnosis not present

## 2024-08-07 DIAGNOSIS — G5603 Carpal tunnel syndrome, bilateral upper limbs: Secondary | ICD-10-CM | POA: Diagnosis not present

## 2024-08-07 DIAGNOSIS — G603 Idiopathic progressive neuropathy: Secondary | ICD-10-CM | POA: Diagnosis not present

## 2024-08-12 ENCOUNTER — Other Ambulatory Visit (HOSPITAL_BASED_OUTPATIENT_CLINIC_OR_DEPARTMENT_OTHER): Payer: Self-pay | Admitting: Family Medicine

## 2024-08-12 DIAGNOSIS — M25551 Pain in right hip: Secondary | ICD-10-CM

## 2024-08-12 DIAGNOSIS — M545 Low back pain, unspecified: Secondary | ICD-10-CM | POA: Diagnosis not present

## 2024-08-12 DIAGNOSIS — M25552 Pain in left hip: Secondary | ICD-10-CM

## 2024-08-13 ENCOUNTER — Ambulatory Visit (HOSPITAL_BASED_OUTPATIENT_CLINIC_OR_DEPARTMENT_OTHER)
Admission: RE | Admit: 2024-08-13 | Discharge: 2024-08-13 | Disposition: A | Discharge status: Home / Self Care | Source: Ambulatory Visit | Attending: Family Medicine | Admitting: Family Medicine

## 2024-08-13 DIAGNOSIS — M51369 Other intervertebral disc degeneration, lumbar region without mention of lumbar back pain or lower extremity pain: Secondary | ICD-10-CM | POA: Diagnosis not present

## 2024-08-13 DIAGNOSIS — K5730 Diverticulosis of large intestine without perforation or abscess without bleeding: Secondary | ICD-10-CM | POA: Diagnosis not present

## 2024-08-13 DIAGNOSIS — M25551 Pain in right hip: Secondary | ICD-10-CM | POA: Diagnosis not present

## 2024-08-13 DIAGNOSIS — M47816 Spondylosis without myelopathy or radiculopathy, lumbar region: Secondary | ICD-10-CM | POA: Diagnosis not present

## 2024-08-13 DIAGNOSIS — M25552 Pain in left hip: Secondary | ICD-10-CM | POA: Diagnosis not present

## 2024-08-13 DIAGNOSIS — N400 Enlarged prostate without lower urinary tract symptoms: Secondary | ICD-10-CM | POA: Diagnosis not present

## 2024-08-26 DIAGNOSIS — M1611 Unilateral primary osteoarthritis, right hip: Secondary | ICD-10-CM | POA: Diagnosis not present

## 2024-09-03 DIAGNOSIS — W57XXXA Bitten or stung by nonvenomous insect and other nonvenomous arthropods, initial encounter: Secondary | ICD-10-CM | POA: Diagnosis not present

## 2024-09-03 DIAGNOSIS — S80262A Insect bite (nonvenomous), left knee, initial encounter: Secondary | ICD-10-CM | POA: Diagnosis not present

## 2024-10-23 ENCOUNTER — Encounter (HOSPITAL_BASED_OUTPATIENT_CLINIC_OR_DEPARTMENT_OTHER): Payer: Self-pay

## 2024-10-29 DIAGNOSIS — G603 Idiopathic progressive neuropathy: Secondary | ICD-10-CM | POA: Diagnosis not present

## 2024-10-29 DIAGNOSIS — M461 Sacroiliitis, not elsewhere classified: Secondary | ICD-10-CM | POA: Diagnosis not present

## 2024-10-29 DIAGNOSIS — R200 Anesthesia of skin: Secondary | ICD-10-CM | POA: Diagnosis not present

## 2024-10-29 DIAGNOSIS — G894 Chronic pain syndrome: Secondary | ICD-10-CM | POA: Diagnosis not present

## 2024-10-29 DIAGNOSIS — M47816 Spondylosis without myelopathy or radiculopathy, lumbar region: Secondary | ICD-10-CM | POA: Diagnosis not present

## 2024-10-31 DIAGNOSIS — H25813 Combined forms of age-related cataract, bilateral: Secondary | ICD-10-CM | POA: Diagnosis not present

## 2024-10-31 DIAGNOSIS — H353132 Nonexudative age-related macular degeneration, bilateral, intermediate dry stage: Secondary | ICD-10-CM | POA: Diagnosis not present

## 2024-10-31 DIAGNOSIS — H35033 Hypertensive retinopathy, bilateral: Secondary | ICD-10-CM | POA: Diagnosis not present

## 2024-10-31 DIAGNOSIS — H11153 Pinguecula, bilateral: Secondary | ICD-10-CM | POA: Diagnosis not present

## 2024-11-11 DIAGNOSIS — H25812 Combined forms of age-related cataract, left eye: Secondary | ICD-10-CM | POA: Diagnosis not present

## 2024-11-19 DIAGNOSIS — G894 Chronic pain syndrome: Secondary | ICD-10-CM | POA: Diagnosis not present

## 2024-11-19 DIAGNOSIS — M47816 Spondylosis without myelopathy or radiculopathy, lumbar region: Secondary | ICD-10-CM | POA: Diagnosis not present

## 2024-11-19 DIAGNOSIS — M545 Low back pain, unspecified: Secondary | ICD-10-CM | POA: Diagnosis not present

## 2024-12-02 DIAGNOSIS — H268 Other specified cataract: Secondary | ICD-10-CM | POA: Diagnosis not present

## 2024-12-02 DIAGNOSIS — H25812 Combined forms of age-related cataract, left eye: Secondary | ICD-10-CM | POA: Diagnosis not present

## 2024-12-16 DIAGNOSIS — Z79899 Other long term (current) drug therapy: Secondary | ICD-10-CM | POA: Diagnosis not present

## 2024-12-16 DIAGNOSIS — M47816 Spondylosis without myelopathy or radiculopathy, lumbar region: Secondary | ICD-10-CM | POA: Diagnosis not present

## 2024-12-16 DIAGNOSIS — G894 Chronic pain syndrome: Secondary | ICD-10-CM | POA: Diagnosis not present

## 2025-01-02 ENCOUNTER — Ambulatory Visit (HOSPITAL_COMMUNITY)
Admission: RE | Admit: 2025-01-02 | Discharge: 2025-01-02 | Disposition: A | Source: Ambulatory Visit | Attending: Cardiology | Admitting: Cardiology

## 2025-01-02 DIAGNOSIS — I351 Nonrheumatic aortic (valve) insufficiency: Secondary | ICD-10-CM | POA: Diagnosis present

## 2025-01-02 LAB — ECHOCARDIOGRAM COMPLETE
AR max vel: 2.82 cm2
AV Area VTI: 2.93 cm2
AV Area mean vel: 2.71 cm2
AV Mean grad: 5 mmHg
AV Peak grad: 8.5 mmHg
Ao pk vel: 1.46 m/s
Area-P 1/2: 2.97 cm2
P 1/2 time: 622 ms
S' Lateral: 2.9 cm

## 2025-01-05 ENCOUNTER — Ambulatory Visit: Admitting: Cardiology

## 2025-01-06 ENCOUNTER — Ambulatory Visit: Payer: Self-pay | Admitting: Cardiology

## 2025-01-17 ENCOUNTER — Other Ambulatory Visit: Payer: Self-pay | Admitting: Cardiology

## 2025-02-11 ENCOUNTER — Ambulatory Visit: Admitting: Cardiology
